# Patient Record
Sex: Female | Born: 1965 | Race: Black or African American | Hispanic: No | Smoking: Never smoker
Health system: Southern US, Community
[De-identification: ages and names within clinical notes are randomized; demographics above are authoritative.]

## PROBLEM LIST (undated history)

## (undated) DIAGNOSIS — D649 Anemia, unspecified: Secondary | ICD-10-CM

## (undated) DIAGNOSIS — K649 Unspecified hemorrhoids: Secondary | ICD-10-CM

## (undated) DIAGNOSIS — N644 Mastodynia: Secondary | ICD-10-CM

## (undated) DIAGNOSIS — I10 Essential (primary) hypertension: Secondary | ICD-10-CM

## (undated) DIAGNOSIS — N920 Excessive and frequent menstruation with regular cycle: Secondary | ICD-10-CM

## (undated) HISTORY — DX: Mastodynia: N64.4

## (undated) HISTORY — DX: Anemia, unspecified: D64.9

## (undated) HISTORY — DX: Unspecified hemorrhoids: K64.9

## (undated) HISTORY — DX: Essential (primary) hypertension: I10

## (undated) HISTORY — DX: Excessive and frequent menstruation with regular cycle: N92.0

## (undated) HISTORY — PX: TONSILLECTOMY: SUR1361

---

## 2002-03-09 ENCOUNTER — Other Ambulatory Visit (HOSPITAL_COMMUNITY): Admission: RE | Admit: 2002-03-09 | Discharge: 2002-03-30 | Payer: Self-pay | Admitting: Psychiatry

## 2006-05-28 ENCOUNTER — Ambulatory Visit: Payer: Self-pay | Admitting: Family Medicine

## 2006-06-07 ENCOUNTER — Ambulatory Visit: Payer: Self-pay | Admitting: *Deleted

## 2006-06-29 ENCOUNTER — Encounter (INDEPENDENT_AMBULATORY_CARE_PROVIDER_SITE_OTHER): Payer: Self-pay | Admitting: Family Medicine

## 2006-06-29 ENCOUNTER — Ambulatory Visit: Payer: Self-pay | Admitting: Family Medicine

## 2006-08-20 ENCOUNTER — Ambulatory Visit (HOSPITAL_COMMUNITY): Admission: RE | Admit: 2006-08-20 | Discharge: 2006-08-20 | Payer: Self-pay | Admitting: Family Medicine

## 2007-02-23 ENCOUNTER — Encounter (INDEPENDENT_AMBULATORY_CARE_PROVIDER_SITE_OTHER): Payer: Self-pay | Admitting: *Deleted

## 2008-05-14 ENCOUNTER — Ambulatory Visit: Payer: Self-pay | Admitting: Family Medicine

## 2008-05-14 ENCOUNTER — Encounter (INDEPENDENT_AMBULATORY_CARE_PROVIDER_SITE_OTHER): Payer: Self-pay | Admitting: Adult Health

## 2008-05-14 LAB — CONVERTED CEMR LAB
Albumin: 4 g/dL (ref 3.5–5.2)
Alkaline Phosphatase: 65 units/L (ref 39–117)
Basophils Absolute: 0.1 10*3/uL (ref 0.0–0.1)
Basophils Relative: 1 % (ref 0–1)
Chloride: 103 meq/L (ref 96–112)
HDL: 56 mg/dL (ref >39)
LDL Cholesterol: 108 mg/dL — ABNORMAL HIGH (ref 0–99)
MCHC: 30.9 g/dL (ref 30.0–36.0)
Monocytes Relative: 8 % (ref 3–12)
Neutro Abs: 4.4 10*3/uL (ref 1.7–7.7)
Neutrophils Relative %: 66 % (ref 43–77)
Potassium: 4.3 meq/L (ref 3.5–5.3)
RBC: 4.6 M/uL (ref 3.87–5.11)
RDW: 15.2 % (ref 11.5–15.5)
Total Bilirubin: 0.4 mg/dL (ref 0.3–1.2)
Triglycerides: 73 mg/dL (ref <150)
VLDL: 15 mg/dL (ref 0–40)

## 2008-06-06 ENCOUNTER — Ambulatory Visit (HOSPITAL_COMMUNITY): Admission: RE | Admit: 2008-06-06 | Discharge: 2008-06-06 | Payer: Self-pay | Admitting: Internal Medicine

## 2008-09-05 ENCOUNTER — Ambulatory Visit: Payer: Self-pay | Admitting: Internal Medicine

## 2009-06-10 ENCOUNTER — Encounter (INDEPENDENT_AMBULATORY_CARE_PROVIDER_SITE_OTHER): Payer: Self-pay | Admitting: Adult Health

## 2009-06-10 ENCOUNTER — Ambulatory Visit (HOSPITAL_COMMUNITY): Admission: RE | Admit: 2009-06-10 | Discharge: 2009-06-10 | Payer: Self-pay | Admitting: Internal Medicine

## 2009-06-10 ENCOUNTER — Ambulatory Visit: Payer: Self-pay | Admitting: Family Medicine

## 2009-06-10 LAB — CONVERTED CEMR LAB
ALT: 9 units/L (ref 0–35)
AST: 14 units/L (ref 0–37)
Albumin: 4.3 g/dL (ref 3.5–5.2)
Alkaline Phosphatase: 73 units/L (ref 39–117)
Calcium: 9.5 mg/dL (ref 8.4–10.5)
Chloride: 100 meq/L (ref 96–112)
Creatinine, Ser: 0.72 mg/dL (ref 0.40–1.20)
LDL Cholesterol: 114 mg/dL — ABNORMAL HIGH (ref 0–99)
Lymphocytes Relative: 30 % (ref 12–46)
Lymphs Abs: 1.7 10*3/uL (ref 0.7–4.0)
Microalb, Ur: 0.5 mg/dL (ref 0.00–1.89)
Neutro Abs: 3.4 10*3/uL (ref 1.7–7.7)
Neutrophils Relative %: 60 % (ref 43–77)
Platelets: 415 10*3/uL — ABNORMAL HIGH (ref 150–400)
Potassium: 4.1 meq/L (ref 3.5–5.3)
TSH: 0.941 microintl units/mL (ref 0.350–4.500)
WBC: 5.6 10*3/uL (ref 4.0–10.5)

## 2009-06-13 ENCOUNTER — Ambulatory Visit: Payer: Self-pay | Admitting: Internal Medicine

## 2012-11-01 ENCOUNTER — Ambulatory Visit: Payer: No Typology Code available for payment source | Attending: Family Medicine | Admitting: Internal Medicine

## 2012-11-01 VITALS — BP 144/91 | HR 84 | Temp 98.7°F | Resp 16 | Wt 237.2 lb

## 2012-11-01 DIAGNOSIS — I10 Essential (primary) hypertension: Secondary | ICD-10-CM | POA: Insufficient documentation

## 2012-11-01 DIAGNOSIS — N938 Other specified abnormal uterine and vaginal bleeding: Secondary | ICD-10-CM | POA: Insufficient documentation

## 2012-11-01 DIAGNOSIS — R51 Headache: Secondary | ICD-10-CM

## 2012-11-01 DIAGNOSIS — N949 Unspecified condition associated with female genital organs and menstrual cycle: Secondary | ICD-10-CM | POA: Insufficient documentation

## 2012-11-01 LAB — BASIC METABOLIC PANEL
Chloride: 102 mEq/L (ref 96–112)
Creat: 0.78 mg/dL (ref 0.50–1.10)
Potassium: 4.3 mEq/L (ref 3.5–5.3)

## 2012-11-01 LAB — CBC WITH DIFFERENTIAL/PLATELET
Basophils Absolute: 0 10*3/uL (ref 0.0–0.1)
HCT: 34.8 % — ABNORMAL LOW (ref 36.0–46.0)
Lymphocytes Relative: 29 % (ref 12–46)
Neutro Abs: 3.7 10*3/uL (ref 1.7–7.7)
Platelets: 429 10*3/uL — ABNORMAL HIGH (ref 150–400)
RBC: 4.47 MIL/uL (ref 3.87–5.11)
RDW: 16.4 % — ABNORMAL HIGH (ref 11.5–15.5)
WBC: 6.1 10*3/uL (ref 4.0–10.5)

## 2012-11-01 MED ORDER — TRAMADOL HCL 50 MG PO TABS: 50.0000 mg | ORAL_TABLET | Freq: Three times a day (TID) | ORAL | Status: DC | PRN

## 2012-11-01 MED ORDER — LISINOPRIL-HYDROCHLOROTHIAZIDE 20-12.5 MG PO TABS: 1.0000 | ORAL_TABLET | Freq: Every day | ORAL | Status: DC

## 2012-11-01 NOTE — Progress Notes (Signed)
Patient states has not been on her blood pressure medication for almost a year Complains of headaches

## 2012-11-01 NOTE — Progress Notes (Signed)
  Subjective:    Patient ID: Marie Garcia, female    DOB: 1965-07-14, 47 y.o.   MRN: [id number]  HPI 47 year old female who presents to [facility] for evaluation of hypertension. The patient has a history of hypertension and was on Zestoretic one year ago. She lost followup with [facility] and has been off her medication. She'll complains of morning headaches. She has checked her blood pressure at [facility] and it is usually in the 180 systolic. Today she is in the 140 systolic. She takes ibuprofen every morning for her headaches.   Review of Systems    H.  As in history of present illness  Past medical history Hypertension   past surgical history None  Allergies None  Family history Positive for breast cancer in the grandmother   Objective:   Physical Exam  General: NAD, resting comfortably in bed Eyes: PEERLA EOMI ENT: mucous membranes moist, patient very hard of hearing Neck: supple w/o JVD Cardiovascular: RRR w/o MRG Respiratory: CTA B Abdomen: soft, nt, nd, bs+ Skin: no rash nor lesion Musculoskeletal: MAE, full ROM all 4 extremities Psychiatric: normal tone and affect Neurologic: AAOx3, grossly non-foc       Assessment & Plan:  Hypertension. The patient will be prescribed systolic We'll obtain baseline labs such as BMP Follow up in 2 weeks  Dysfunctional uterine bleeding-the patient states that she bleeds about 15 days a month for the last 8 months. She denies any shortness of breath, we'll obtain a CBC, gynecologic referral

## 2013-01-10 ENCOUNTER — Ambulatory Visit: Payer: No Typology Code available for payment source | Attending: Family Medicine | Admitting: Internal Medicine

## 2013-01-10 VITALS — BP 136/89 | HR 98 | Temp 98.9°F | Ht 61.0 in | Wt 236.0 lb

## 2013-01-10 DIAGNOSIS — N644 Mastodynia: Secondary | ICD-10-CM

## 2013-01-10 DIAGNOSIS — D539 Nutritional anemia, unspecified: Secondary | ICD-10-CM

## 2013-01-10 DIAGNOSIS — I10 Essential (primary) hypertension: Secondary | ICD-10-CM | POA: Insufficient documentation

## 2013-01-10 LAB — COMPREHENSIVE METABOLIC PANEL
ALT: 11 U/L (ref 0–35)
AST: 13 U/L (ref 0–37)
BUN: 8 mg/dL (ref 6–23)
CO2: 28 mEq/L (ref 19–32)
Chloride: 103 mEq/L (ref 96–112)
Creat: 0.77 mg/dL (ref 0.50–1.10)
Potassium: 4.6 mEq/L (ref 3.5–5.3)

## 2013-01-10 LAB — LIPID PANEL
HDL: 50 mg/dL (ref >39)
LDL Cholesterol: 114 mg/dL — ABNORMAL HIGH (ref 0–99)
Total CHOL/HDL Ratio: 3.6 Ratio
Triglycerides: 74 mg/dL (ref <150)

## 2013-01-10 LAB — HEMOGLOBIN A1C: Hgb A1c MFr Bld: 6.3 % — ABNORMAL HIGH (ref <5.7)

## 2013-01-10 MED ORDER — LISINOPRIL-HYDROCHLOROTHIAZIDE 20-25 MG PO TABS: 1.0000 | ORAL_TABLET | Freq: Every day | ORAL | Status: DC

## 2013-01-10 NOTE — Progress Notes (Signed)
Patient ID: Marie Garcia, female   DOB: 1966-01-03, 47 y.o.   MRN: [id number]  CC:  HPI: 47 year old female who is here for evaluation of her hypertension. The patient was last seen in May when she presented with complaints of a headache. She was started on Zestoretic. Her headaches have since improved today she is complaining of pain in her left breast. She had a mammogram that was negative in 2009. Pain is described as sharp episodic not related to exertion Patient denies any shortness of breath per se She continues to have heavy menstrual periods, and is yet to receive a gynecologic referral   No Known Allergies No past medical history on file. Current Outpatient Prescriptions on File Prior to Visit  Medication Sig Dispense Refill  . lisinopril-hydrochlorothiazide (ZESTORETIC) 20-12.5 MG per tablet Take 1 tablet by mouth daily.  90 tablet  3  . traMADol (ULTRAM) 50 MG tablet Take 1 tablet (50 mg total) by mouth every 8 (eight) hours as needed for pain.  30 tablet  0   No current facility-administered medications on file prior to visit.   No family history on file. History   Social History  . Marital Status: Legally Separated    Spouse Name: N/A    Number of Children: N/A  . Years of Education: N/A   Occupational History  . Not on file.   Social History Main Topics  . Smoking status: Not on file  . Smokeless tobacco: Not on file  . Alcohol Use: Not on file  . Drug Use: Not on file  . Sexually Active: Not on file   Other Topics Concern  . Not on file   Social History Narrative  . No narrative on file    Review of Systems  Constitutional: Negative for fever, chills, diaphoresis, activity change, appetite change and fatigue.  HENT: Negative for ear pain, nosebleeds, congestion, facial swelling, rhinorrhea, neck pain, neck stiffness and ear discharge.   Eyes: Negative for pain, discharge, redness, itching and visual disturbance.  Respiratory: Negative for cough, choking,  chest tightness, shortness of breath, wheezing and stridor.   Cardiovascular: Negative for chest pain, palpitations and leg swelling.  Gastrointestinal: Negative for abdominal distention.  Genitourinary: Negative for dysuria, urgency, frequency, hematuria, flank pain, decreased urine volume, difficulty urinating and dyspareunia.  Musculoskeletal: Negative for back pain, joint swelling, arthralgias and gait problem.  Neurological: Negative for dizziness, tremors, seizures, syncope, facial asymmetry, speech difficulty, weakness, light-headedness, numbness and headaches.  Hematological: Negative for adenopathy. Does not bruise/bleed easily.  Psychiatric/Behavioral: Negative for hallucinations, behavioral problems, confusion, dysphoric mood, decreased concentration and agitation.    Objective:   Filed Vitals:   01/10/13 0951  BP: 136/89  Pulse: 98  Temp: 98.9 F (37.2 C)    Physical Exam  Constitutional: Appears well-developed and well-nourished. No distress.  HENT: Normocephalic. External right and left ear normal. Oropharynx is clear and moist.  Eyes: Conjunctivae and EOM are normal. PERRLA, no scleral icterus.  Neck: Normal ROM. Neck supple. No JVD. No tracheal deviation. No thyromegaly.  [facility]: RRR, S1/S2 +, no murmurs, no gallops, no carotid bruit.  Pulmonary: Effort and breath sounds normal, no stridor, rhonchi, wheezes, rales.  Abdominal: Soft. BS +,  no distension, tenderness, rebound or guarding.  Musculoskeletal: Normal range of motion. No edema and no tenderness.  Lymphadenopathy: No lymphadenopathy noted, cervical, inguinal. Neuro: Alert. Normal reflexes, muscle tone coordination. No cranial nerve deficit. Skin: Skin is warm and dry. No rash noted. Not diaphoretic. No erythema.  No pallor.  Psychiatric: Normal mood and affect. Behavior, judgment, thought content normal.   Lab Results  Component Value Date   WBC 6.1 11/01/2012   HGB 11.2* 11/01/2012   HCT 34.8* 11/01/2012    MCV 77.9* 11/01/2012   PLT 429* 11/01/2012   Lab Results  Component Value Date   CREATININE 0.78 11/01/2012   BUN 9 11/01/2012   NA 135 11/01/2012   K 4.3 11/01/2012   CL 102 11/01/2012   CO2 26 11/01/2012    No results found for this basename: HGBA1C   Lipid Panel     Component Value Date/Time   CHOL 186 06/10/2009 1908   TRIG 70 06/10/2009 1908   HDL 58 06/10/2009 1908   CHOLHDL 3.2 Ratio 06/10/2009 1908   VLDL 14 06/10/2009 1908   LDLCALC 114* 06/10/2009 1908       Assessment and plan:   There are no active problems to display for this patient.  Hypertension continue Zestoretic, 2 refills provided We'll check a BMP today  Health maintenance Will check lipid panel, hemoglobin A1c  Left-sided chest pain we'll check a mammogram and schedule the patient for an outpatient stress test, atypical chest pain  Anemia heavy menstrual periods, schedule the patient for a pelvic ultrasound  See the patient back in 3 weeks

## 2013-01-17 ENCOUNTER — Ambulatory Visit (HOSPITAL_COMMUNITY)
Admission: RE | Admit: 2013-01-17 | Discharge: 2013-01-17 | Disposition: A | Discharge status: Home / Self Care | Payer: No Typology Code available for payment source | Source: Ambulatory Visit | Attending: Internal Medicine | Admitting: Internal Medicine

## 2013-01-17 DIAGNOSIS — D539 Nutritional anemia, unspecified: Secondary | ICD-10-CM

## 2013-01-17 DIAGNOSIS — N920 Excessive and frequent menstruation with regular cycle: Secondary | ICD-10-CM | POA: Insufficient documentation

## 2013-01-24 ENCOUNTER — Ambulatory Visit
Admission: RE | Admit: 2013-01-24 | Discharge: 2013-01-24 | Disposition: A | Discharge status: Home / Self Care | Payer: No Typology Code available for payment source | Source: Ambulatory Visit | Attending: Family Medicine | Admitting: Family Medicine

## 2013-01-24 DIAGNOSIS — N644 Mastodynia: Secondary | ICD-10-CM

## 2013-01-31 ENCOUNTER — Ambulatory Visit: Payer: No Typology Code available for payment source | Admitting: Internal Medicine

## 2013-02-07 ENCOUNTER — Encounter: Payer: Self-pay | Admitting: *Deleted

## 2013-02-07 ENCOUNTER — Encounter: Payer: Self-pay | Admitting: Cardiology

## 2013-02-07 ENCOUNTER — Ambulatory Visit (INDEPENDENT_AMBULATORY_CARE_PROVIDER_SITE_OTHER): Payer: No Typology Code available for payment source | Admitting: Cardiology

## 2013-02-07 VITALS — BP 138/80 | HR 104 | Ht 60.0 in | Wt 233.0 lb

## 2013-02-07 DIAGNOSIS — N644 Mastodynia: Secondary | ICD-10-CM | POA: Insufficient documentation

## 2013-02-07 DIAGNOSIS — I10 Essential (primary) hypertension: Secondary | ICD-10-CM | POA: Insufficient documentation

## 2013-02-07 DIAGNOSIS — R079 Chest pain, unspecified: Secondary | ICD-10-CM

## 2013-02-07 DIAGNOSIS — D649 Anemia, unspecified: Secondary | ICD-10-CM | POA: Insufficient documentation

## 2013-02-07 DIAGNOSIS — R5383 Other fatigue: Secondary | ICD-10-CM

## 2013-02-07 DIAGNOSIS — G4730 Sleep apnea, unspecified: Secondary | ICD-10-CM

## 2013-02-07 DIAGNOSIS — R5381 Other malaise: Secondary | ICD-10-CM

## 2013-02-07 NOTE — Assessment & Plan Note (Signed)
Patient counseled on weight loss. Her hemoglobin A1c was recently elevated. We discussed this and the risk for diabetes mellitus. I recommended diet, exercise and weight loss and followup with primary care.

## 2013-02-07 NOTE — Assessment & Plan Note (Signed)
Symptoms atypical and not consistent with cardiac pain. Electrocardiogram normal. I do not think further cardiac evaluation indicated.

## 2013-02-07 NOTE — Assessment & Plan Note (Signed)
Continue present blood pressure medications. 

## 2013-02-07 NOTE — Progress Notes (Signed)
  HPI: 47 year old female for evaluation of chest pain. Patient has no prior cardiac history. She describes intermittent pain in the left breast area. It is described as a sharp pain lasting 1-2 seconds. It is not exertional, pleuritic. It does not radiate and there are no associated symptoms. She has dyspnea with more extreme activities but not routine activities. No orthopnea, PND, palpitations, syncope or exertional chest pain. She also describes fatigue. She has some dizziness after taking her a.m. Blood pressure medications that lasted approximately 30 minutes.  Current Outpatient Prescriptions  Medication Sig Dispense Refill  . lisinopril-hydrochlorothiazide (PRINZIDE,ZESTORETIC) 20-25 MG per tablet Take 1 tablet by mouth daily.  30 tablet  2  . Multiple Vitamin (MULTIVITAMIN) capsule Take 1 capsule by mouth daily.      . traMADol (ULTRAM) 50 MG tablet Take 1 tablet (50 mg total) by mouth every 8 (eight) hours as needed for pain.  30 tablet  0   No current facility-administered medications for this visit.    No Known Allergies  Past Medical History  Diagnosis Date  . Breast pain   . Anemia   . HTN (hypertension)     Past Surgical History  Procedure Laterality Date  . Tonsillectomy      History   Social History  . Marital Status: Legally Separated    Spouse Name: N/A    Number of Children: 3  . Years of Education: N/A   Occupational History  .      [employer]   Social History Main Topics  . Smoking status: Never Smoker   . Smokeless tobacco: Not on file  . Alcohol Use: No  . Drug Use: No  . Sexual Activity: Not on file   Other Topics Concern  . Not on file   Social History Narrative  . No narrative on file    Family History  Problem Relation Age of Onset  . Breast cancer Maternal Grandmother     ROS: no fevers or chills, productive cough, hemoptysis, dysphasia, odynophagia, melena, hematochezia, dysuria, hematuria, rash, seizure activity, orthopnea, PND, pedal  edema, claudication. Remaining systems are negative.  Physical Exam:   Blood pressure 138/80, pulse 104, height 5' (1.524 m), weight 233 lb (105.688 kg), last menstrual period 01/07/2013.  General:  Well developed/obese in NAD Skin warm/dry Patient not depressed No peripheral clubbing Back-normal HEENT-normal/normal eyelids Neck supple/normal carotid upstroke bilaterally; no bruits; no JVD; no thyromegaly chest - CTA/ normal expansion CV - RRR/normal S1 and S2; no murmurs, rubs or gallops;  PMI nondisplaced Abdomen -NT/ND, no HSM, no mass, + bowel sounds, no bruit 2+ femoral pulses, no bruits Ext-no edema, chords, 2+ DP Neuro-grossly nonfocal  ECG sinus tachycardia, left axis deviation, no ST changes.

## 2013-02-07 NOTE — Patient Instructions (Addendum)
Your physician recommends that you schedule a follow-up appointment in: AS NEEDED  REFERRAL TO PULMONARY FOR SLEEP APNEA  Your physician recommends that you HAVE LAB WORK TODAY

## 2013-02-07 NOTE — Assessment & Plan Note (Signed)
Etiology unclear. She has mild microcytic anemia possibly secondary to menstruation. Further evaluation per primary care. She does snore and certainly sleep apnea could be contributing. I will asked pulmonary to review for sleep study. Check TSH.

## 2013-02-08 LAB — TSH: TSH: 1.08 u[IU]/mL (ref 0.35–5.50)

## 2013-02-13 ENCOUNTER — Encounter: Payer: Self-pay | Admitting: Obstetrics & Gynecology

## 2013-03-13 ENCOUNTER — Institutional Professional Consult (permissible substitution): Payer: No Typology Code available for payment source | Admitting: Pulmonary Disease

## 2013-03-27 ENCOUNTER — Encounter: Payer: Self-pay | Admitting: Obstetrics & Gynecology

## 2013-03-27 ENCOUNTER — Other Ambulatory Visit (HOSPITAL_COMMUNITY)
Admission: RE | Admit: 2013-03-27 | Discharge: 2013-03-27 | Disposition: A | Discharge status: Home / Self Care | Payer: No Typology Code available for payment source | Source: Ambulatory Visit | Attending: Obstetrics & Gynecology | Admitting: Obstetrics & Gynecology

## 2013-03-27 ENCOUNTER — Ambulatory Visit (INDEPENDENT_AMBULATORY_CARE_PROVIDER_SITE_OTHER): Payer: No Typology Code available for payment source | Admitting: Obstetrics & Gynecology

## 2013-03-27 VITALS — BP 130/81 | HR 112 | Temp 98.2°F | Ht 60.0 in | Wt 223.5 lb

## 2013-03-27 DIAGNOSIS — N920 Excessive and frequent menstruation with regular cycle: Secondary | ICD-10-CM

## 2013-03-27 DIAGNOSIS — Z01812 Encounter for preprocedural laboratory examination: Secondary | ICD-10-CM

## 2013-03-27 LAB — POCT PREGNANCY, URINE: Preg Test, Ur: NEGATIVE

## 2013-03-27 MED ORDER — NORGESTIMATE-ETH ESTRADIOL 0.25-35 MG-MCG PO TABS: 1.0000 | ORAL_TABLET | Freq: Every day | ORAL | Status: DC

## 2013-03-27 NOTE — Progress Notes (Signed)
Subjective:     Patient ID: Marie Garcia, female   DOB: 20-May-1966, 47 y.o.   MRN: [id number]  HPI Pt reports several months of heavy vaginal bleeding.  She reports that she may bleed as much as 20+days.  She had a sono ordered by her primary care physician but, has not seen an OB/GYN.  She denies pain assoc with the bleeding but, does c/o hot flushes that are very bothersome.  She has been taking care of her husband who died yesterday of lung cancer,  His funeral is in 3 days.  She denies unexplained weight loss.  Past Medical History  Diagnosis Date  . Breast pain   . Anemia   . HTN (hypertension)   . Menorrhagia    Past Surgical History  Procedure Laterality Date  . Tonsillectomy     Current Outpatient Prescriptions on File Prior to Visit  Medication Sig Dispense Refill  . lisinopril-hydrochlorothiazide (PRINZIDE,ZESTORETIC) 20-25 MG per tablet Take 1 tablet by mouth daily.  30 tablet  2  . Multiple Vitamin (MULTIVITAMIN) capsule Take 1 capsule by mouth daily.      . traMADol (ULTRAM) 50 MG tablet Take 1 tablet (50 mg total) by mouth every 8 (eight) hours as needed for pain.  30 tablet  0   No current facility-administered medications on file prior to visit.  No Known Allergies History   Social History  . Marital Status: Legally Separated    Spouse Name: N/A    Number of Children: 3  . Years of Education: N/A   Occupational History  .      [employer]   Social History Main Topics  . Smoking status: Never Smoker   . Smokeless tobacco: Not on file  . Alcohol Use: No  . Drug Use: No  . Sexual Activity: No   Other Topics Concern  . Not on file   Social History Narrative  . No narrative on file          Review of Systems     Objective:   Physical Exam BP 130/81  Pulse 112  Temp(Src) 98.2 F (36.8 C)  Ht 5' (1.524 m)  Wt 223 lb 8 oz (101.379 kg)  BMI 43.65 kg/m2  LMP 03/24/2013 GU: EGBUS: no lesions Vagina: no blood in vault Cervix: no lesion; no  mucopurulent d/c Uterus: enlarged ~12 weeks sized Adnexa: no masses; nontender     The indications for endometrial biopsy were reviewed.   Risks of the biopsy including cramping, bleeding, infection, uterine perforation, inadequate specimen and need for additional procedures  were discussed. The patient states she understands and agrees to undergo procedure today. Consent was signed. Time out was performed. Urine HCG was negative. A sterile speculum was placed in the patient's vagina and the cervix was prepped with Betadine. A single-toothed tenaculum was placed on the anterior lip of the cervix to stabilize it. The 3 mm pipelle was introduced into the endometrial cavity without difficulty to a depth of 9.5cm, and a moderate amount of tissue was obtained and sent to pathology. The instruments were removed from the patient's vagina. Minimal bleeding from the cervix was noted. The patient tolerated the procedure well.   01/17/2013 TRANSABDOMINAL ULTRASOUND OF PELVIS  Technique: Transabdominal ultrasound examination of the pelvis was  performed including evaluation of the uterus, ovaries, adnexal  regions, and pelvic cul-de-sac.  Comparison: None.  Findings:  Uterus: Measures 12.4 x 5.3 x 6.2 cm. Anteverted. Homogeneous.  No evidence of mass.  Endometrium: Normal in thickness and appearance. Measures 8 mm in  thickness.  Right ovary: Normal appearance/no adnexal mass. Measures 2.1 x 2.6  x 2.6 cm.  Left ovary: Normal appearance/no adnexal mass. Measures 2.5 x 1.6  x 2.7 cm.  Other Findings: No free fluid  IMPRESSION:  Normal study. No evidence of pelvic mass or other significant  abnormality.     Assessment:     menorrhagia suspect due to perimenopause - discussed with pt treatment options including OCP's.  Pt with no contraindications to OCP's     Plan:     sprintec 1 po q day F/u in 2 months F/u results of endobx    Routine post-procedure instructions were given to the patient.  The patient will follow up to review the results

## 2013-03-27 NOTE — Progress Notes (Signed)
States has been having very heavy periods for about 6 months and they last up to 23 days.

## 2013-03-27 NOTE — Patient Instructions (Signed)
Menopause Menopause is the normal time of life when menstrual periods stop completely. Menopause is complete when you have missed 12 consecutive menstrual periods. It usually occurs between the ages of 48 to 55, with an average age of 51. Very rarely does a woman develop menopause before 49 years old. At menopause, your ovaries stop producing the female hormones, estrogen and progesterone. This can cause undesirable symptoms and also affect your health. Sometimes the symptoms may occur 4 to 5 years before the menopause begins. There is no relationship between menopause and:  Oral contraceptives.  Number of children you had.  Race.  The age your menstrual periods started (menarche). Heavy smokers and very thin women may develop menopause earlier in life. CAUSES  The ovaries stop producing the female hormones estrogen and progesterone.  Other causes include:  Surgery to remove both ovaries.  The ovaries stop functioning for no known reason.  Tumors of the pituitary gland in the brain.  Medical disease that affects the ovaries and hormone production.  Radiation treatment to the abdomen or pelvis.  Chemotherapy that affects the ovaries. SYMPTOMS   Hot flashes.  Night sweats.  Decrease in sex drive.  Vaginal dryness and thinning of the vagina causing painful intercourse.  Dryness of the skin and developing wrinkles.  Headaches.  Tiredness.  Irritability.  Memory problems.  Weight gain.  Bladder infections.  Hair growth of the face and chest.  Infertility. More serious symptoms include:  Loss of bone (osteoporosis) causing breaks (fractures).  Depression.  Hardening and narrowing of the arteries (atherosclerosis) causing heart attacks and strokes. DIAGNOSIS   When the menstrual periods have stopped for 12 straight months.  Physical exam.  Hormone studies of the blood. TREATMENT  There are many treatment choices and nearly as many questions about them.  The decisions to treat or not to treat menopausal changes is an individual choice made with your caregiver. Your caregiver can discuss the treatments with you. Together, you can decide which treatment will work best for you. Your treatment choices may include:   Hormone therapy (estorgen and progesterone).  Non-hormonal medications.  Treating the individual symptoms with medication (for example antidepressants for depression).  Herbal medications that may help specific symptoms.  Counseling by a psychiatrist or psychologist.  Group therapy.  Lifestyle changes including:  Eating healthy.  Regular exercise.  Limiting caffeine and alcohol.  Stress management and meditation.  No treatment. HOME CARE INSTRUCTIONS   Take the medication your caregiver gives you as directed.  Get plenty of sleep and rest.  Exercise regularly.  Eat a diet that contains calcium (good for the bones) and soy products (acts like estrogen hormone).  Avoid alcoholic beverages.  Do not smoke.  If you have hot flashes, dress in layers.  Take supplements, calcium and vitamin D to strengthen bones.  You can use over-the-counter lubricants or moisturizers for vaginal dryness.  Group therapy is sometimes very helpful.  Acupuncture may be helpful in some cases. SEEK MEDICAL CARE IF:   You are not sure you are in menopause.  You are having menopausal symptoms and need advice and treatment.  You are still having menstrual periods after age 55.  You have pain with intercourse.  Menopause is complete (no menstrual period for 12 months) and you develop vaginal bleeding.  You need a referral to a specialist (gynecologist, psychiatrist or psychologist) for treatment. SEEK IMMEDIATE MEDICAL CARE IF:   You have severe depression.  You have excessive vaginal bleeding.  You fell and   think you have a broken bone.  You have pain when you urinate.  You develop leg or chest pain.  You have a fast  pounding heart beat (palpitations).  You have severe headaches.  You develop vision problems.  You feel a lump in your breast.  You have abdominal pain or severe indigestion. Document Released: 08/15/2003 Document Revised: 08/17/2011 Document Reviewed: 03/22/2008 [facility] Patient Information 2014 [place], [place]. Perimenopause Perimenopause is the time when your body begins to move into the menopause (no menstrual period for 12 straight months). It is a natural process. Perimenopause can begin 2 to 8 years before the menopause and usually lasts for one year after the menopause. During this time, your ovaries may or may not produce an egg. The ovaries vary in their production of estrogen and progesterone hormones each month. This can cause irregular menstrual periods, difficulty in getting pregnant, vaginal bleeding between periods and uncomfortable symptoms. CAUSES  Irregular production of the ovarian hormones, estrogen and progesterone, and not ovulating every month.  Other causes include:  Tumor of the pituitary gland in the brain.  Medical disease that affects the ovaries.  Radiation treatment.  Chemotherapy.  Unknown causes.  Heavy smoking and excessive alcohol intake can bring on perimenopause sooner. SYMPTOMS   Hot flashes.  Night sweats.  Irregular menstrual periods.  Decrease sex drive.  Vaginal dryness.  Headaches.  Mood swings.  Depression.  Memory problems.  Irritability.  Tiredness.  Weight gain.  Trouble getting pregnant.  The beginning of losing bone cells (osteoporosis).  The beginning of hardening of the arteries (atherosclerosis). DIAGNOSIS  Your caregiver will make a diagnosis by analyzing your age, menstrual history and your symptoms. They will do a physical exam noting any changes in your body, especially your female organs. Female hormone tests may or may not be helpful depending on the amount and when you produce the female hormones.  However, other hormone tests may be helpful (ex. thyroid hormone) to rule out other problems. TREATMENT  The decision to treat during the perimenopause should be made by you and your caregiver depending on how the symptoms are affecting you and your life style. There are various treatments available such as:  Treating individual symptoms with a specific medication for that symptom (ex. tranquilizer for depression).  Herbal medications that can help specific symptoms.  Counseling.  Group therapy.  No treatment. HOME CARE INSTRUCTIONS   Before seeing your caregiver, make a list of your menstrual periods (when the occur, how heavy they are, how long between periods and how long they last), your symptoms and when they started.  Take the medication as recommended by your caregiver.  Sleep and rest.  Exercise.  Eat a diet that contains calcium (good for your bones) and soy (acts like estrogen hormone).  Do not smoke.  Avoid alcoholic beverages.  Taking vitamin E may help in certain cases.  Take calcium and vitamin D supplements to help prevent bone loss.  Group therapy is sometimes helpful.  Acupuncture may help in some cases. SEEK MEDICAL CARE IF:   You have any of the above and want to know if it is perimenopause.  You want advice and treatment for any of your symptoms mentioned above.  You need a referral to a specialist (gynecologist, psychiatrist or psychologist). SEEK IMMEDIATE MEDICAL CARE IF:   You have vaginal bleeding.  Your period lasts longer than 8 days.  You periods are recurring sooner than 21 days.  You have bleeding after intercourse.  You  have severe depression.  You have pain when you urinate.  You have severe headaches.  You develop vision problems. Document Released: 07/02/2004 Document Revised: 08/17/2011 Document Reviewed: 03/22/2008 [facility] Patient Information 2014 [place], [place].

## 2013-03-29 ENCOUNTER — Encounter: Payer: Self-pay | Admitting: *Deleted

## 2013-04-05 ENCOUNTER — Telehealth: Payer: Self-pay | Admitting: General Practice

## 2013-04-05 NOTE — Telephone Encounter (Signed)
Called patient and informed her of results and recommendations. Patient verbalized understanding to all and had no further questions  

## 2013-04-05 NOTE — Telephone Encounter (Signed)
Message copied by Kathee Delton on Wed Apr 05, 2013  1:00 PM ------      Message from: Odelia Gage A      Created: Tue Apr 04, 2013 12:19 PM                   ----- Message -----         From: Willodean Rosenthal, MD         Sent: 04/03/2013   2:04 PM           To: [facility]            Please call pt and notify her of normal endo bx.  She is to continue Sprintec and f/u in 2-3 months as scheduled.            Thx,      clh-S  ------

## 2013-04-19 ENCOUNTER — Institutional Professional Consult (permissible substitution): Payer: No Typology Code available for payment source | Admitting: Pulmonary Disease

## 2013-04-20 ENCOUNTER — Encounter: Payer: Self-pay | Admitting: Pulmonary Disease

## 2013-04-22 ENCOUNTER — Other Ambulatory Visit: Payer: Self-pay | Admitting: Emergency Medicine

## 2013-04-22 MED ORDER — LISINOPRIL-HYDROCHLOROTHIAZIDE 20-25 MG PO TABS: 1.0000 | ORAL_TABLET | Freq: Every day | ORAL | Status: DC

## 2013-09-17 ENCOUNTER — Other Ambulatory Visit: Payer: Self-pay | Admitting: Internal Medicine

## 2013-12-14 ENCOUNTER — Encounter (HOSPITAL_COMMUNITY): Payer: Self-pay | Admitting: Emergency Medicine

## 2013-12-14 ENCOUNTER — Emergency Department (HOSPITAL_COMMUNITY)
Admission: EM | Admit: 2013-12-14 | Discharge: 2013-12-14 | Disposition: A | Discharge status: Home / Self Care | Payer: Self-pay | Attending: Emergency Medicine | Admitting: Emergency Medicine

## 2013-12-14 ENCOUNTER — Emergency Department (HOSPITAL_COMMUNITY): Payer: Self-pay

## 2013-12-14 DIAGNOSIS — I10 Essential (primary) hypertension: Secondary | ICD-10-CM | POA: Insufficient documentation

## 2013-12-14 DIAGNOSIS — Z862 Personal history of diseases of the blood and blood-forming organs and certain disorders involving the immune mechanism: Secondary | ICD-10-CM | POA: Insufficient documentation

## 2013-12-14 DIAGNOSIS — Z79899 Other long term (current) drug therapy: Secondary | ICD-10-CM | POA: Insufficient documentation

## 2013-12-14 DIAGNOSIS — R1030 Lower abdominal pain, unspecified: Secondary | ICD-10-CM

## 2013-12-14 DIAGNOSIS — Z791 Long term (current) use of non-steroidal anti-inflammatories (NSAID): Secondary | ICD-10-CM | POA: Insufficient documentation

## 2013-12-14 DIAGNOSIS — I880 Nonspecific mesenteric lymphadenitis: Secondary | ICD-10-CM | POA: Insufficient documentation

## 2013-12-14 DIAGNOSIS — Z8742 Personal history of other diseases of the female genital tract: Secondary | ICD-10-CM | POA: Insufficient documentation

## 2013-12-14 DIAGNOSIS — R195 Other fecal abnormalities: Secondary | ICD-10-CM | POA: Insufficient documentation

## 2013-12-14 LAB — COMPREHENSIVE METABOLIC PANEL
ALBUMIN: 3.5 g/dL (ref 3.5–5.2)
ALT: 12 U/L (ref 0–35)
ANION GAP: 14 (ref 5–15)
AST: 23 U/L (ref 0–37)
Alkaline Phosphatase: 78 U/L (ref 39–117)
BUN: 9 mg/dL (ref 6–23)
CHLORIDE: 98 meq/L (ref 96–112)
CO2: 23 meq/L (ref 19–32)
CREATININE: 0.71 mg/dL (ref 0.50–1.10)
Calcium: 10 mg/dL (ref 8.4–10.5)
GFR calc Af Amer: >90 mL/min (ref >90)
Glucose, Bld: 118 mg/dL — ABNORMAL HIGH (ref 70–99)
Potassium: 4.1 mEq/L (ref 3.7–5.3)
Sodium: 135 mEq/L — ABNORMAL LOW (ref 137–147)
Total Protein: 7.2 g/dL (ref 6.0–8.3)

## 2013-12-14 LAB — CBC
HEMATOCRIT: 34.5 % — AB (ref 36.0–46.0)
HEMOGLOBIN: 11.4 g/dL — AB (ref 12.0–15.0)
MCH: 27 pg (ref 26.0–34.0)
MCHC: 33 g/dL (ref 30.0–36.0)
MCV: 81.8 fL (ref 78.0–100.0)
PLATELETS: 376 10*3/uL (ref 150–400)
RBC: 4.22 MIL/uL (ref 3.87–5.11)
RDW: 14.2 % (ref 11.5–15.5)
WBC: 6.3 10*3/uL (ref 4.0–10.5)

## 2013-12-14 LAB — TYPE AND SCREEN
ABO/RH(D): B POS
Antibody Screen: NEGATIVE

## 2013-12-14 LAB — POC OCCULT BLOOD, ED: Fecal Occult Bld: NEGATIVE

## 2013-12-14 LAB — LIPASE, BLOOD: Lipase: 32 U/L (ref 11–59)

## 2013-12-14 LAB — I-STAT CG4 LACTIC ACID, ED: LACTIC ACID, VENOUS: 1.02 mmol/L (ref 0.5–2.2)

## 2013-12-14 LAB — ABO/RH: ABO/RH(D): B POS

## 2013-12-14 MED ORDER — NAPROXEN 375 MG PO TABS: 375.0000 mg | ORAL_TABLET | Freq: Two times a day (BID) | ORAL | Status: DC

## 2013-12-14 MED ORDER — HYDROMORPHONE HCL PF 1 MG/ML IJ SOLN
1.0000 mg | Freq: Once | INTRAMUSCULAR | Status: AC
  Administered 2013-12-14: 1 mg via INTRAVENOUS
  Filled 2013-12-14: qty 1

## 2013-12-14 MED ORDER — SODIUM CHLORIDE 0.9 % IV BOLUS (SEPSIS)
1000.0000 mL | Freq: Once | INTRAVENOUS | Status: AC
Start: 2013-12-14 — End: 2013-12-14
  Administered 2013-12-14: 1000 mL via INTRAVENOUS

## 2013-12-14 MED ORDER — IOHEXOL 300 MG/ML  SOLN
50.0000 mL | Freq: Once | INTRAMUSCULAR | Status: AC | PRN
  Administered 2013-12-14: 50 mL via ORAL

## 2013-12-14 MED ORDER — HYDROCODONE-ACETAMINOPHEN 5-325 MG PO TABS: 2.0000 | ORAL_TABLET | ORAL | Status: DC | PRN

## 2013-12-14 MED ORDER — IOHEXOL 300 MG/ML  SOLN
100.0000 mL | Freq: Once | INTRAMUSCULAR | Status: AC | PRN
  Administered 2013-12-14: 100 mL via INTRAVENOUS

## 2013-12-14 MED ORDER — HYDROCODONE-ACETAMINOPHEN 5-325 MG PO TABS
2.0000 | ORAL_TABLET | Freq: Once | ORAL | Status: DC
  Filled 2013-12-14: qty 2

## 2013-12-14 NOTE — ED Provider Notes (Signed)
CSN: [id number]     Arrival date & time 12/14/13  1038 History   First MD Initiated Contact with Patient 12/14/13 1048     Chief Complaint  Patient presents with  . Abdominal Pain     (Consider location/radiation/quality/duration/timing/severity/associated sxs/prior Treatment) HPI Comments: 48 year old female with history of anemia, high blood pressure, obesity presents with intermittent crampy/ache abdominal pain central and lower bilateral for the past week with intermittent small amounts of blood in the stool.  Patient denies history of colonoscopy or known GI bleeding history. Episodes worse with BM and wiping.   No fevers or vomiting.    Patient is a 48 y.o. female presenting with abdominal pain. The history is provided by the patient.  Abdominal Pain Associated symptoms: no chest pain, no chills, no dysuria, no fever, no shortness of breath and no vomiting     Past Medical History  Diagnosis Date  . Breast pain   . Anemia   . HTN (hypertension)   . Menorrhagia    Past Surgical History  Procedure Laterality Date  . Tonsillectomy     Family History  Problem Relation Age of Onset  . Breast cancer Maternal Grandmother    History  Substance Use Topics  . Smoking status: Never Smoker   . Smokeless tobacco: Not on file  . Alcohol Use: No   OB History   Grav Para Term Preterm Abortions TAB SAB Ect Mult Living                 Review of Systems  Constitutional: Negative for fever and chills.  HENT: Negative for congestion.   Eyes: Negative for visual disturbance.  Respiratory: Negative for shortness of breath.   Cardiovascular: Negative for chest pain.  Gastrointestinal: Positive for abdominal pain and blood in stool. Negative for vomiting.  Genitourinary: Negative for dysuria and flank pain.  Musculoskeletal: Negative for back pain, neck pain and neck stiffness.  Skin: Negative for rash.  Neurological: Negative for light-headedness and headaches.      Allergies   Review of patient's allergies indicates no known allergies.  Home Medications   Prior to Admission medications   Medication Sig Start Date End Date Taking? Authorizing Provider  ibuprofen (ADVIL,MOTRIN) 200 MG tablet Take 400 mg by mouth every 6 (six) hours as needed for moderate pain.   Yes Historical Provider, MD  lisinopril-hydrochlorothiazide (PRINZIDE,ZESTORETIC) 20-25 MG per tablet Take 1 tablet by mouth daily. 04/22/13  Yes Jeanann Lewandowskylugbemiga Jegede, MD  Multiple Vitamin (MULTIVITAMIN) capsule Take 1 capsule by mouth daily.   Yes Historical Provider, MD  HYDROcodone-acetaminophen (NORCO) 5-325 MG per tablet Take 2 tablets by mouth every 4 (four) hours as needed. 12/14/13   Enid SkeensJoshua M Myleah Cavendish, MD  naproxen (NAPROSYN) 375 MG tablet Take 1 tablet (375 mg total) by mouth 2 (two) times daily. 12/14/13   Enid SkeensJoshua M Brae Schaafsma, MD   BP 152/93  Pulse 66  Temp(Src) 98.1 F (36.7 C) (Oral)  Resp 18  SpO2 97%  LMP 12/08/2013 Physical Exam  Nursing note and vitals reviewed. Constitutional: She is oriented to person, place, and time. She appears well-developed and well-nourished.  HENT:  Head: Normocephalic and atraumatic.  Eyes: Conjunctivae are normal. Right eye exhibits no discharge. Left eye exhibits no discharge.  Neck: Normal range of motion. Neck supple. No tracheal deviation present.  Cardiovascular: Normal rate and regular rhythm.   Pulmonary/Chest: Effort normal and breath sounds normal.  Abdominal: Soft. She exhibits no distension. There is tenderness (mild diffuse worse central and  LLQ). There is no guarding.  Musculoskeletal: She exhibits no edema.  Neurological: She is alert and oriented to person, place, and time.  Skin: Skin is warm. No rash noted.  Psychiatric: She has a normal mood and affect.    ED Course  Procedures (including critical care time) Labs Review Labs Reviewed  CBC - Abnormal; Notable for the following:    Hemoglobin 11.4 (*)    HCT 34.5 (*)    All other components  within normal limits  COMPREHENSIVE METABOLIC PANEL - Abnormal; Notable for the following:    Sodium 135 (*)    Glucose, Bld 118 (*)    Total Bilirubin <0.2 (*)    All other components within normal limits  LIPASE, BLOOD  POC OCCULT BLOOD, ED  I-STAT CG4 LACTIC ACID, ED  TYPE AND SCREEN  ABO/RH    Imaging Review Ct Abdomen Pelvis W Contrast  12/14/2013   CLINICAL DATA:  Abdominal pain.  Rectal bleeding.  EXAM: CT ABDOMEN AND PELVIS WITH CONTRAST  TECHNIQUE: Multidetector CT imaging of the abdomen and pelvis was performed using the standard protocol following bolus administration of intravenous contrast.  CONTRAST:  92mLScottsdale Eye InClCommunity Hospital Of San Berna22BerBurna [id number] KitchenrKn72m9wSpringhill MedClWellbridge Hospital Of Fort 278BerBurna 430865KoMarland KitchenrKno78m3wChattanooga Pain Management Center LLC Dba Chat[facility] Pain SurClA M Surgery C51BerBurna 200865KoMarland KitchenrKno80w5doyalty AndersonL 300 MG/ML SOLN, 10<MEASUREMENTBehavioral Healthcare Center At Huntsville, Inc.[place] Pi1[facility] Cen40tNaval ar22cChristene Raliegh Cha73m84Clearview SurgeryClBay State Wing Memorial Hospital And Medical Ce33BerBurna 510865KoMarland KitchenrKno42w5doyalty Andersonimiro Nee1Venetia Max7<MEASUREMENTMercy Health Lakeshore CampusNewman Pi1[facility] Hosp5iNaval ar50cChristene Raliegh [id number] Nee1Venetia Max8<MEASUREMENTPacific Endo Surgical Center LPNewman Pi1Saint Thomas West HospitalA7nNaval ar44cChristene Raliegh Cha48413292Orlan9147Casimiro Nee1Venetia Max3<MEASUREMENTSuncoast Specialty Surgery Center LlLPNewman Pi1[facility] Hospit48aNaval ar22cChristene Raliegh Cha3m84[facility] MedClSaint Andrews Hospital And Healthcare C38BerBurna 650865KoMarland KitchenrKno39w5doyalty Andersonimiro Nee1Venetia Max(5<MEASUREMENTBaylor Scott And White PavilionNewman Pi1[facility] Cent24eNaval ar31cChristene Raliegh Cha48413292Orlan9147Casimiro Nee1Venetia Max5<MEASUREMENTCoordinated Health Orthopedic HospitalNewman Pi1Dr [facility] Cen94tNaval ar29cChristene Raliegh Cha42m84Advanced Eye SurgeryClMethodist Hospital Union C2BerBurna 580865KoMarland KitchenrKno66w5doyalty Andersonimiro Nee1Venetia Ma<MEASUREMENTBethesda Hospital WestN60eNaval ar3873mChJohn C Stennis MemoriClSt Joseph'S Hospital 822BerBurna 340865KoMarland KitchenrKno39m1wMontgomery Eye SurgeryClLaser And Cataract Center Of Shrevepor334BerBurna 400865KoMarland KitchenrKno40w5doyalty Anderson13Orlan914Venetia Max7<MEASUREMENTDivine Providence HospitalNewman Pi1N[facility] Health [facility] Sy75sNaval ar21cChristene Raliegh Cha48413292Orlan9147Casimiro Nee1Venetia Max4<MEASUREMENTNaval Hospital GuamNewman Pi1T J [facility] Hosp40iNaval ar50cChristene Raliegh Cha16m84Southern Crescent EndoscoClChi St Joseph Health Madison Hos779BerBurna 160865KoMarland KitchenrKno56w5doyalty Andersonimiro Nee1Venetia Max(25<MEASUREMENTMercy Medical CenterNewman Pi1[facility] Center34 Naval ar29cChristene Raliegh Cha31m84[facility] Dba Confluence HealClHunterdon Medical C619BerBurna 30865KoMarland KitchenrKno89w5doyalty Andersonimiro Nee1Venetia Max9<MEASUREMENTPacific Endo Surgical Center LPNewman Pi1[facility]23ANaval ar66cChristene Raliegh Cha53m84Coast SurgerClPenn State Hershey Rehabilitation Hos5BerBurna 30865KoMarland KitchenrKno66w5doyalty Andersonimiro Nee1Venetia Max7<MEASUREMENTFort Walton Beach Medical CenterNewman Pi1[facility] PaA47pNaval ar59cChristene Raliegh Cha48413292Orlan9147Casimiro Nee1Venetia Max5<MEASUREMENTCascade Valley Arlington Surgery CenterNewman Pi1[facility] Heal56tNaval ar86cChristene Raliegh Cha67m84St. ElizClAlta Bates Summit Med Ctr-Summit Campus-S1BerBurna 320865KoMarland KitchenrKno32w5doyalty Andersonimiro Nee1Venetia Max2<MEASUREMENTWilmington Health PLLCNewman Pi1Endoscopy Center Of Da32yNaval ar23cChristene Raliegh Cha58m84Kind[facility] - Fort WortClRoper St Francis [facility] Hos759BerBurna 10865KoMarland KitchenrKno43w5doyalty Andersonimiro Nee1Venetia Ma<MEASUREMENTMichigan Endoscopy Center LLCNewman Pi1Maricopa Medical Cente75rNaval ar65cChristene Raliegh Cha56m84Summit Surgery Centere St MClParkview Adventist Medical Center : [facility] Hos66BerBurna 510865KoMarland KitchenrKno28w5doyalty Andersonimiro Nee1Venetia Max2<MEASUREMENTDupont Surgery CenterNewman Pi1C S Medical LLC Dba [facility] Surgical Arts75LNaval ar12cChristene Raliegh Cha65m84[facility] At ClCommunity Mental Health Cente90BerBurna 890865KoMarland KitchenrKno23w5doyalty Andersonimiro Nee1Venetia Max(3<MEASUREMENTAbrom Kaplan Memorial HospitalNewman Pi1Cole[facility] Cataract [facility] Laser Surgery Cent62eNaval ar34cChristene Raliegh Cha38m84[facility]are Of ClChrist Hos427BerBurna 870865KoMarland KitchenrKno67w5doyalty Andersonimiro Nee1Venetia Max2<MEASUREMENTBaptist HospitalNewman Pi1St. Elizabeth OwenChesapeake Ra65nNaval ar67cChristene Raliegh Cha34ClWake Forest Outpatient Endoscopy B115m08Walter O[facility] Moss Regional MedClBurgess Memorial Hos379BerBurna 600865KoMarland KitchenrKno23w5doyalty Andersonimiro Nee1Venetia Max5<MEASUREMENTJordan Valley Medical Center West Valley CampusNewman Pi1[facility] In56cNaval ar71cChristene Raliegh Cha53m84[facility] AcClPeacehealth Gastroenterology Endoscopy C640BerBurna 630865KoMarland KitchenrKno72w5doyalty Andersonimiro Nee1Venetia Max9<MEASUREMENTOlympic Medical CenterNewman Pi1[facility] Faci78lNaval ar70cChristene Raliegh [id number] Nee1Venetia Maxon0lethro BolusrdXOL 300 MG/ML SOLN  COMPARISON:  None.  FINDINGS: Liver normal. Spleen normal. Pancreas normal. No biliary distention. The gallbladder is nondistended.  Adrenals normal. Kidneys normal. Bladder is nondistended. Uterus and adnexa unremarkable.  Shotty inguinal and retroperitoneal lymph nodes are noted.  Appendix is unremarkable. Large amount of stool is noted throughout the colon. Colon is nondistended. There is no bowel distention. Small hiatal hernia. Stomach is nondistended. No free air. Small mesenteric lymph nodes are noted. Mesenteric adenitis could present in this fashion. No significant hernia.  Heart size normal.  Lung bases clear.  No acute bony abnormality.  IMPRESSION: 1. Nonspecific shotty inguinal and retroperitoneal lymph nodes. 2. Tiny nonspecific mesenteric lymph nodes. Mesenteric adenitis cannot be excluded.   Electronically Signed   By: Thomas  Register   On: 12/14/2013 14:57     EKG Interpretation None      MDM   Final diagnoses:  Abdominal pain, lower  Occult blood in stools  Mesenteric adenitis   Clinically concern for colitis/ diverticulitis vs internal hemorrhoid.  No active bleeding in ED, labs Unremarkable.  Pain improved on reassessment.  CT results reviewed, mild adenitis.  Pt has no  vascular hx and vitals unremarkable mild htn. Fup with GI discussed.   Results and differential diagnosis were discussed with the patient/parent/guardian. Close follow up outpatient was discussed, comfortable with the plan.   Medications  HYDROcodone-acetaminophen (NORCO/VICODIN) 5-325 MG per tablet 2 tablet (not administered)  HYDROmorphone (DILAUDID) injection 1 mg (1 mg Intravenous Given 12/14/13 1202)  sodium chloride 0.9 % bolus 1,000 mL (0 mLs Intravenous Stopped 12/14/13 1312)  iohexol (OMNIPAQUE) 300 MG/ML solution 50 mL (50 mLs Oral Contrast Given 12/14/13 1218)  iohexol (OMNIPAQUE) 300 MG/ML solution 100 mL (100 mLs Intravenous Contrast Given 12/14/13 1438)    Filed Vitals:   12/14/13 1045 12/14/13 1200 12/14/13 1300  BP: 134/90 133/82 152/93  Pulse: 91 76 66  Temp: 98.1 F (36.7 C)    TempSrc: Oral    Resp: 18    SpO2: 100% 97% 97%        Hydeia Mcatee M Carina Chaplin, MD 12/14/13 1516

## 2013-12-14 NOTE — ED Notes (Signed)
Initial Contact - pt resting on stretcher, reports RLQ/LLQ abd pain, distention, also BRBPR x1 week.  Pt denies n/v/d/c, denies fevers/chills, denies hx of similar.  Skin PWD.  A+Ox4.  Speaking full/clear sentences.  Abd s/TTP, distended.  NAD.

## 2013-12-14 NOTE — Progress Notes (Signed)
[facility] CL provided pt with a [facility] [employer] Card application to help patient establish primary care.  °

## 2013-12-14 NOTE — ED Notes (Signed)
Pt states that she has had generalized abd pain and rectal bleeding w/ BRB x 1 and a half wks.  States that it happens every time she uses the bathroom.  Denies any constipation or diarrhea.

## 2013-12-14 NOTE — ED Notes (Addendum)
Pt reports rectal bleeding around stool and with wiping for a week with severe abdominal pain and distention. Pt denies n/v/d.

## 2013-12-14 NOTE — Discharge Instructions (Signed)
If you were given medicines take as directed.  If you are on coumadin or contraceptives realize their levels and effectiveness is altered by many different medicines.  If you have any reaction (rash, tongues swelling, other) to the medicines stop taking and see a physician.   Please follow up as directed and return to the ER or see a physician for new or worsening symptoms.  Thank you. Filed Vitals:   12/14/13 1045 12/14/13 1200 12/14/13 1300  BP: 134/90 133/82 152/93  Pulse: 91 76 66  Temp: 98.1 F (36.7 C)    TempSrc: Oral    Resp: 18    SpO2: 100% 97% 97%    Abdominal Pain Many things can cause abdominal pain. Usually, abdominal pain is not caused by a disease and will improve without treatment. It can often be observed and treated at home. Your health care provider will do a physical exam and possibly order blood tests and X-rays to help determine the seriousness of your pain. However, in many cases, more time must pass before a clear cause of the pain can be found. Before that point, your health care provider may not know if you need more testing or further treatment. HOME CARE INSTRUCTIONS  Monitor your abdominal pain for any changes. The following actions may help to alleviate any discomfort you are experiencing:  Only take over-the-counter or prescription medicines as directed by your health care provider.  Do not take laxatives unless directed to do so by your health care provider.  Try a clear liquid diet (broth, tea, or water) as directed by your health care provider. Slowly move to a bland diet as tolerated. SEEK MEDICAL CARE IF:  You have unexplained abdominal pain.  You have abdominal pain associated with nausea or diarrhea.  You have pain when you urinate or have a bowel movement.  You experience abdominal pain that wakes you in the night.  You have abdominal pain that is worsened or improved by eating food.  You have abdominal pain that is worsened with eating fatty  foods.  You have a fever. SEEK IMMEDIATE MEDICAL CARE IF:   Your pain does not go away within 2 hours.  You keep throwing up (vomiting).  Your pain is felt only in portions of the abdomen, such as the right side or the left lower portion of the abdomen.  You pass bloody or black tarry stools. MAKE SURE YOU:  Understand these instructions.   Will watch your condition.   Will get help right away if you are not doing well or get worse.  Document Released: 03/04/2005 Document Revised: 05/30/2013 Document Reviewed: 02/01/2013 [facility] StokesExitCare Patient Information 2015 [place], [place]. This information is not intended to replace advice given to you by your health care provider. Make sure you discuss any questions you have with your health care provider.

## 2013-12-14 NOTE — ED Notes (Signed)
Pt to CT

## 2013-12-14 NOTE — ED Notes (Signed)
Pt not given Vicodin as pt is driving home.

## 2014-01-09 ENCOUNTER — Ambulatory Visit: Payer: No Typology Code available for payment source | Attending: Internal Medicine

## 2014-01-12 ENCOUNTER — Ambulatory Visit: Payer: No Typology Code available for payment source | Attending: Internal Medicine | Admitting: Internal Medicine

## 2014-01-12 ENCOUNTER — Encounter: Payer: Self-pay | Admitting: Internal Medicine

## 2014-01-12 VITALS — BP 129/84 | HR 79 | Temp 99.2°F | Resp 16 | Ht 60.0 in | Wt 231.0 lb

## 2014-01-12 DIAGNOSIS — D649 Anemia, unspecified: Secondary | ICD-10-CM | POA: Insufficient documentation

## 2014-01-12 DIAGNOSIS — I10 Essential (primary) hypertension: Secondary | ICD-10-CM | POA: Insufficient documentation

## 2014-01-12 DIAGNOSIS — Z79899 Other long term (current) drug therapy: Secondary | ICD-10-CM | POA: Insufficient documentation

## 2014-01-12 DIAGNOSIS — Z1239 Encounter for other screening for malignant neoplasm of breast: Secondary | ICD-10-CM | POA: Insufficient documentation

## 2014-01-12 MED ORDER — LISINOPRIL-HYDROCHLOROTHIAZIDE 20-25 MG PO TABS: 1.0000 | ORAL_TABLET | Freq: Every day | ORAL | Status: DC

## 2014-01-12 NOTE — Progress Notes (Signed)
Pt here for medication refill blood pressure medication States she ran out 7 months ago C/o intermit headache with feet swelling Denies chest pain or sob BP 129/84 79 Requesting mammogram/Colonoscopy

## 2014-01-12 NOTE — Progress Notes (Signed)
Patient ID: Marie Garcia, female   DOB: Nov 13, 1965, 48 y.o.   MRN: [id number]  CC: HTN  HPI:  Patient reports that she takes her medication daily and has no compliants related to medication.  She reports a daily headache for the past few mornings last only one hour but reports that she has been out of medication for about 7 days.  She admits to occasional leg swelling. She request an order for colonoscopy and mammography.    No Known Allergies Past Medical History  Diagnosis Date  . Breast pain   . Anemia   . HTN (hypertension)   . Menorrhagia    Current Outpatient Prescriptions on File Prior to Visit  Medication Sig Dispense Refill  . HYDROcodone-acetaminophen (NORCO) 5-325 MG per tablet Take 2 tablets by mouth every 4 (four) hours as needed.  6 tablet  0  . ibuprofen (ADVIL,MOTRIN) 200 MG tablet Take 400 mg by mouth every 6 (six) hours as needed for moderate pain.      Marland Kitchen lisinopril-hydrochlorothiazide (PRINZIDE,ZESTORETIC) 20-25 MG per tablet Take 1 tablet by mouth daily.  30 tablet  2  . Multiple Vitamin (MULTIVITAMIN) capsule Take 1 capsule by mouth daily.      . naproxen (NAPROSYN) 375 MG tablet Take 1 tablet (375 mg total) by mouth 2 (two) times daily.  12 tablet  0   No current facility-administered medications on file prior to visit.   Family History  Problem Relation Age of Onset  . Breast cancer Maternal Grandmother    History   Social History  . Marital Status: Legally Separated    Spouse Name: N/A    Number of Children: 3  . Years of Education: N/A   Occupational History  .      [employer]   Social History Main Topics  . Smoking status: Never Smoker   . Smokeless tobacco: Not on file  . Alcohol Use: No  . Drug Use: No  . Sexual Activity: No   Other Topics Concern  . Not on file   Social History Narrative  . No narrative on file   Review of Systems  Eyes: Negative.   Respiratory: Negative.   Cardiovascular: Positive for leg swelling (improves with  antihypertensive meds). Negative for chest pain, palpitations, orthopnea and claudication.  Gastrointestinal: Negative.  Negative for abdominal pain.  Genitourinary: Negative.   Neurological: Positive for headaches. Negative for dizziness and tingling.     Objective:   Filed Vitals:   01/12/14 1430  BP: 129/84  Pulse: 79  Temp: 99.2 F (37.3 C)  Resp: 16    Physical Exam: Constitutional: Patient appears well-developed and well-nourished. No distress. HENT: Normocephalic, atraumatic, External right and left ear normal. Oropharynx is clear and moist.  Eyes: Conjunctivae and EOM are normal. PERRLA, no scleral icterus. Neck: Normal ROM. Neck supple. No JVD. No tracheal deviation. No thyromegaly. CVS: RRR, S1/S2 +, no murmurs, no gallops, no carotid bruit.  Pulmonary: Effort and breath sounds normal, no stridor, rhonchi, wheezes, rales.  Abdominal: Soft. BS +,  no distension, tenderness, rebound or guarding.  Musculoskeletal: Normal range of motion. No edema and no tenderness.  Lymphadenopathy: No lymphadenopathy noted, cervical Neuro: Alert. Normal reflexes Skin: Skin is warm and dry. No rash noted. Not diaphoretic. No erythema. No pallor. Psychiatric: Normal mood and affect. Behavior, judgment, thought content normal.  Lab Results  Component Value Date   WBC 6.3 12/14/2013   HGB 11.4* 12/14/2013   HCT 34.5* 12/14/2013   MCV  81.8 12/14/2013   PLT 376 12/14/2013   Lab Results  Component Value Date   CREATININE 0.71 12/14/2013   BUN 9 12/14/2013   NA 135* 12/14/2013   K 4.1 12/14/2013   CL 98 12/14/2013   CO2 23 12/14/2013    Lab Results  Component Value Date   HGBA1C 6.3* 01/10/2013   Lipid Panel     Component Value Date/Time   CHOL 179 01/10/2013 1007   TRIG 74 01/10/2013 1007   HDL 50 01/10/2013 1007   CHOLHDL 3.6 01/10/2013 1007   VLDL 15 01/10/2013 1007   LDLCALC 114* 01/10/2013 1007       Assessment and plan:   Marie Garcia was seen today for follow-up, hypertension and medication  refill.  Diagnoses and associated orders for this visit:  Essential hypertension - lisinopril-hydrochlorothiazide (PRINZIDE,ZESTORETIC) 20-25 MG per tablet; Take 1 tablet by mouth daily.  Breast cancer screening - MM Digital Screening; Future Patient will call back when she gets the [employer] discount to get referral for colonoscopy.   Return in about 6 months (around 07/15/2014) for Hypertension.    Holland Commons, NP-C [facility] [phone] 02/13/2014, 1:02 PM

## 2014-01-12 NOTE — Patient Instructions (Signed)
DASH Eating Plan °DASH stands for "Dietary Approaches to Stop Hypertension." The DASH eating plan is a healthy eating plan that has been shown to reduce high blood pressure (hypertension). Additional health benefits may include reducing the risk of type 2 diabetes mellitus, heart disease, and stroke. The DASH eating plan may also help with weight loss. °WHAT DO I NEED TO KNOW ABOUT THE DASH EATING PLAN? °For the DASH eating plan, you will follow these general guidelines: °· Choose foods with a percent daily value for sodium of less than 5% (as listed on the food label). °· Use salt-free seasonings or herbs instead of table salt or sea salt. °· Check with your health care provider or pharmacist before using salt substitutes. °· Eat lower-sodium products, often labeled as "lower sodium" or "no salt added." °· Eat fresh foods. °· Eat more vegetables, fruits, and low-fat dairy products. °· Choose whole grains. Look for the word "whole" as the first word in the ingredient list. °· Choose fish and skinless chicken or turkey more often than red meat. Limit fish, poultry, and meat to 6 oz (170 g) each day. °· Limit sweets, desserts, sugars, and sugary drinks. °· Choose heart-healthy fats. °· Limit cheese to 1 oz (28 g) per day. °· Eat more home-cooked food and less restaurant, buffet, and fast food. °· Limit fried foods. °· Cook foods using methods other than frying. °· Limit canned vegetables. If you do use them, rinse them well to decrease the sodium. °· When eating at a restaurant, ask that your food be prepared with less salt, or no salt if possible. °WHAT FOODS CAN I EAT? °Seek help from a dietitian for individual calorie needs. °Grains °Whole grain or whole wheat bread. Brown rice. Whole grain or whole wheat pasta. Quinoa, bulgur, and whole grain cereals. Low-sodium cereals. Corn or whole wheat flour tortillas. Whole grain cornbread. Whole grain crackers. Low-sodium crackers. °Vegetables °Fresh or frozen vegetables  (raw, steamed, roasted, or grilled). Low-sodium or reduced-sodium tomato and vegetable juices. Low-sodium or reduced-sodium tomato sauce and paste. Low-sodium or reduced-sodium canned vegetables.  °Fruits °All fresh, canned (in natural juice), or frozen fruits. °Meat and Other Protein Products °Ground beef (85% or leaner), grass-fed beef, or beef trimmed of fat. Skinless chicken or turkey. Ground chicken or turkey. Pork trimmed of fat. All fish and seafood. Eggs. Dried beans, peas, or lentils. Unsalted nuts and seeds. Unsalted canned beans. °Dairy °Low-fat dairy products, such as skim or 1% milk, 2% or reduced-fat cheeses, low-fat ricotta or cottage cheese, or plain low-fat yogurt. Low-sodium or reduced-sodium cheeses. °Fats and Oils °Tub margarines without trans fats. Light or reduced-fat mayonnaise and salad dressings (reduced sodium). Avocado. Safflower, olive, or canola oils. Natural peanut or almond butter. °Other °Unsalted popcorn and pretzels. °The items listed above may not be a complete list of recommended foods or beverages. Contact your dietitian for more options. °WHAT FOODS ARE NOT RECOMMENDED? °Grains °White bread. White pasta. White rice. Refined cornbread. Bagels and croissants. Crackers that contain trans fat. °Vegetables °Creamed or fried vegetables. Vegetables in a cheese sauce. Regular canned vegetables. Regular canned tomato sauce and paste. Regular tomato and vegetable juices. °Fruits °Dried fruits. Canned fruit in light or heavy syrup. Fruit juice. °Meat and Other Protein Products °Fatty cuts of meat. Ribs, chicken wings, bacon, sausage, bologna, salami, chitterlings, fatback, hot dogs, bratwurst, and packaged luncheon meats. Salted nuts and seeds. Canned beans with salt. °Dairy °Whole or 2% milk, cream, half-and-half, and cream cheese. Whole-fat or sweetened yogurt. Full-fat   cheeses or blue cheese. Nondairy creamers and whipped toppings. Processed cheese, cheese spreads, or cheese  curds. °Condiments °Onion and garlic salt, seasoned salt, table salt, and sea salt. Canned and packaged gravies. Worcestershire sauce. Tartar sauce. Barbecue sauce. Teriyaki sauce. Soy sauce, including reduced sodium. Steak sauce. Fish sauce. Oyster sauce. Cocktail sauce. Horseradish. Ketchup and mustard. Meat flavorings and tenderizers. Bouillon cubes. Hot sauce. Tabasco sauce. Marinades. Taco seasonings. Relishes. °Fats and Oils °Butter, stick margarine, lard, shortening, ghee, and bacon fat. Coconut, palm kernel, or palm oils. Regular salad dressings. °Other °Pickles and olives. Salted popcorn and pretzels. °The items listed above may not be a complete list of foods and beverages to avoid. Contact your dietitian for more information. °WHERE CAN I FIND MORE INFORMATION? °National [employer], [employer], [employer] Institute: www.nhlbi.nih.gov/health/health-topics/topics/dash/ °Document Released: 05/14/2011 Document Revised: 10/09/2013 Document Reviewed: 03/29/2013 °ExitCare® Patient Information ©2015 [employer]. This information is not intended to replace advice given to you by your health care provider. Make sure you discuss any questions you have with your health care provider. ° °

## 2014-01-26 ENCOUNTER — Ambulatory Visit
Admission: RE | Admit: 2014-01-26 | Discharge: 2014-01-26 | Disposition: A | Discharge status: Home / Self Care | Payer: No Typology Code available for payment source | Source: Ambulatory Visit | Attending: Internal Medicine | Admitting: Internal Medicine

## 2014-01-26 DIAGNOSIS — Z1239 Encounter for other screening for malignant neoplasm of breast: Secondary | ICD-10-CM

## 2014-02-16 ENCOUNTER — Emergency Department (HOSPITAL_COMMUNITY)
Admission: EM | Admit: 2014-02-16 | Discharge: 2014-02-16 | Disposition: A | Discharge status: Home / Self Care | Payer: Self-pay | Attending: Emergency Medicine | Admitting: Emergency Medicine

## 2014-02-16 ENCOUNTER — Encounter (HOSPITAL_COMMUNITY): Payer: Self-pay | Admitting: Emergency Medicine

## 2014-02-16 ENCOUNTER — Ambulatory Visit: Payer: No Typology Code available for payment source

## 2014-02-16 DIAGNOSIS — I10 Essential (primary) hypertension: Secondary | ICD-10-CM | POA: Insufficient documentation

## 2014-02-16 DIAGNOSIS — IMO0002 Reserved for concepts with insufficient information to code with codable children: Secondary | ICD-10-CM | POA: Insufficient documentation

## 2014-02-16 DIAGNOSIS — Z79899 Other long term (current) drug therapy: Secondary | ICD-10-CM | POA: Insufficient documentation

## 2014-02-16 DIAGNOSIS — Z862 Personal history of diseases of the blood and blood-forming organs and certain disorders involving the immune mechanism: Secondary | ICD-10-CM | POA: Insufficient documentation

## 2014-02-16 DIAGNOSIS — L258 Unspecified contact dermatitis due to other agents: Secondary | ICD-10-CM | POA: Insufficient documentation

## 2014-02-16 DIAGNOSIS — R21 Rash and other nonspecific skin eruption: Secondary | ICD-10-CM | POA: Insufficient documentation

## 2014-02-16 DIAGNOSIS — L259 Unspecified contact dermatitis, unspecified cause: Secondary | ICD-10-CM

## 2014-02-16 DIAGNOSIS — Z8742 Personal history of other diseases of the female genital tract: Secondary | ICD-10-CM | POA: Insufficient documentation

## 2014-02-16 MED ORDER — METHYLPREDNISOLONE SODIUM SUCC 125 MG IJ SOLR: 125.0000 mg | Freq: Once | INTRAMUSCULAR | Status: DC

## 2014-02-16 MED ORDER — PENICILLIN V POTASSIUM 500 MG PO TABS: 500.0000 mg | ORAL_TABLET | Freq: Once | ORAL | Status: DC

## 2014-02-16 MED ORDER — METHYLPREDNISOLONE SODIUM SUCC 125 MG IJ SOLR
125.0000 mg | Freq: Once | INTRAMUSCULAR | Status: AC
  Administered 2014-02-16: 125 mg via INTRAMUSCULAR
  Filled 2014-02-16: qty 2

## 2014-02-16 MED ORDER — PREDNISONE 20 MG PO TABS: ORAL_TABLET | ORAL | Status: DC

## 2014-02-16 MED ORDER — HYDROXYZINE HCL 25 MG PO TABS: 25.0000 mg | ORAL_TABLET | Freq: Four times a day (QID) | ORAL | Status: DC

## 2014-02-16 NOTE — ED Notes (Signed)
Per pt, multiple sites/lesions to enter body.  Legs arms. Nothing on face/back or belly.  Areas look like bug bites.  Pt did cut grass Tuesday morning.  Pt bites showed up that night.  Very itchy skin.

## 2014-02-16 NOTE — ED Provider Notes (Signed)
Medical screening examination/treatment/procedure(s) were performed by non-physician practitioner and as supervising physician I was immediately available for consultation/collaboration.   EKG Interpretation None       Adisyn Ruscitti R. Tanise Russman, MD 02/16/14 2354 

## 2014-02-16 NOTE — ED Provider Notes (Signed)
CSN: [id number]     Arrival date & time 02/16/14  1755 History  This chart was scribed for non-physician practitioner working with Harrold Donath R. Rubin Payor, MD, by Roxy Cedar ED Scribe. This patient was seen in room WTR5/WTR5 and the patient's care was started at 8:09 PM  Chief Complaint  Patient presents with  . Rash   The history is provided by the patient. No language interpreter was used.   HPI Comments: Marie Garcia is a 48 y.o. female who presents to the Emergency Department complaining of a moderately itchy rash that began 3 days ago while patient was cutting shrubs outside. Patient states she has rash on arms and lower legs bilaterally and on neck. She states that she has taken benadryl with no relief. Patient states she does not have a prior history of a similar rash. She denies any food or environmental allergies. Patient denies any exposure to new laundry detergents or chemical agents. She denies any crusting or drainage noticed to rash. Patient describes rash as fluid filled blisters. Patient denies history of DM.   Past Medical History  Diagnosis Date  . Breast pain   . Anemia   . HTN (hypertension)   . Menorrhagia    Past Surgical History  Procedure Laterality Date  . Tonsillectomy     Family History  Problem Relation Age of Onset  . Breast cancer Maternal Grandmother    History  Substance Use Topics  . Smoking status: Never Smoker   . Smokeless tobacco: Not on file  . Alcohol Use: No   OB History   Grav Para Term Preterm Abortions TAB SAB Ect Mult Living                 Review of Systems  Constitutional: Negative for fever and chills.  HENT: Negative for facial swelling and trouble swallowing.   Eyes: Negative for redness.  Respiratory: Negative for shortness of breath, wheezing and stridor.   Cardiovascular: Negative for chest pain.  Gastrointestinal: Negative for nausea and vomiting.  Musculoskeletal: Negative for myalgias.  Skin: Positive for rash.   Allergic/Immunologic: Negative for environmental allergies and food allergies.  Neurological: Negative for light-headedness.  Psychiatric/Behavioral: Negative for confusion.   Allergies  Review of patient's allergies indicates no known allergies.  Home Medications   Prior to Admission medications   Medication Sig Start Date End Date Taking? Authorizing Provider  HYDROcodone-acetaminophen (NORCO) 5-325 MG per tablet Take 2 tablets by mouth every 4 (four) hours as needed. 12/14/13   Enid Skeens, MD  ibuprofen (ADVIL,MOTRIN) 200 MG tablet Take 400 mg by mouth every 6 (six) hours as needed for moderate pain.    Historical Provider, MD  lisinopril-hydrochlorothiazide (PRINZIDE,ZESTORETIC) 20-25 MG per tablet Take 1 tablet by mouth daily. 01/12/14   Ambrose Finland, NP  Multiple Vitamin (MULTIVITAMIN) capsule Take 1 capsule by mouth daily.    Historical Provider, MD  naproxen (NAPROSYN) 375 MG tablet Take 1 tablet (375 mg total) by mouth 2 (two) times daily. 12/14/13   Enid Skeens, MD   Triage Vitals: BP 133/88  Pulse 96  Temp(Src) 98.9 F (37.2 C) (Oral)  Resp 16  SpO2 99%  LMP 01/28/2014  Physical Exam  Nursing note and vitals reviewed. Constitutional: She is oriented to person, place, and time. She appears well-developed and well-nourished. No distress.  HENT:  Head: Normocephalic and atraumatic.  Eyes: Conjunctivae and EOM are normal.  Neck: Neck supple. No tracheal deviation present.  Cardiovascular: Normal rate.  Pulmonary/Chest: Effort normal. No respiratory distress.  Musculoskeletal: Normal range of motion.  Neurological: She is alert and oriented to person, place, and time.  Skin: Skin is warm and dry. Rash (contact dermatitis) noted.  Scattered vesicles, some in a linear distribution, in some exposed areas of upper extremities, ankles, posterior neck consistent with allergic contact dermatitis  Psychiatric: She has a normal mood and affect. Her behavior is normal.    ED Course  Procedures (including critical care time)  DIAGNOSTIC STUDIES: Oxygen Saturation is 99% on RA, normal by my interpretation.    COORDINATION OF CARE: 8:23 PM- Discussed plans to give patient steroid medication. Pt advised of plan for treatment and pt agrees.  Labs Review Labs Reviewed - No data to display  Imaging Review No results found.   EKG Interpretation None      Vital signs reviewed and are as follows: Filed Vitals:   02/16/14 1814  BP: 133/88  Pulse: 96  Temp: 98.9 F (37.2 C)  Resp: 16   IM Solu-Medrol given in ED. Prednisone and Atarax at home. Discussed need to take entire course of steroid taper to prevent rebound. Patient verbalizes understanding and agrees with plan.     MDM   Final diagnoses:  Contact dermatitis   Patient with skin rash consistent with allergic contact dermatitis. No signs of anaphylaxis. No signs of SJS. Do not suspect tickborne illness or viral exanthem. Patient appears well, nontoxic.  I personally performed the services described in this documentation, which was scribed in my presence. The recorded information has been reviewed and is accurate.   Renne Crigler, PA-C 02/16/14 2030

## 2014-02-16 NOTE — Discharge Instructions (Signed)
Please read and follow all provided instructions.  Your diagnoses today include:  1. Contact dermatitis     Tests performed today include:  Vital signs. See below for your results today.   Medications prescribed:   Prednisone - steroid medicine   It is best to take this medication in the morning to prevent sleeping problems. If you are diabetic, monitor your blood sugar closely and stop taking Prednisone if blood sugar is over 300. Take with food to prevent stomach upset.    Hydroxyzine - antihistamine  You can find this medication over-the-counter.   This medication will make you drowsy. DO NOT drive or perform any activities that require you to be awake and alert if taking this.  Take any prescribed medications only as directed.  Home care instructions:   Follow any educational materials contained in this packet  Follow-up instructions: Please follow-up with your primary care provider in the next 7 days for further evaluation of your symptoms.   Return instructions:   Please return to the Emergency Department if you experience worsening symptoms.   Call 9-1-1 immediately if you have an allergic reaction that involves your lips, mouth, throat or if you have any difficulty breathing. This is a life-threatening emergency.   Please return if you have any other emergent concerns.  Additional Information:  Your vital signs today were: BP 133/88   Pulse 96   Temp(Src) 98.9 F (37.2 C) (Oral)   Resp 16   SpO2 99%   LMP 01/28/2014 If your blood pressure (BP) was elevated above 135/85 this visit, please have this repeated by your doctor within one month. --------------

## 2014-09-07 ENCOUNTER — Encounter (HOSPITAL_COMMUNITY): Payer: Self-pay | Admitting: Emergency Medicine

## 2014-09-07 ENCOUNTER — Emergency Department (HOSPITAL_COMMUNITY)
Admission: EM | Admit: 2014-09-07 | Discharge: 2014-09-07 | Disposition: A | Discharge status: Home / Self Care | Payer: Self-pay | Attending: Emergency Medicine | Admitting: Emergency Medicine

## 2014-09-07 DIAGNOSIS — Z791 Long term (current) use of non-steroidal anti-inflammatories (NSAID): Secondary | ICD-10-CM | POA: Insufficient documentation

## 2014-09-07 DIAGNOSIS — D649 Anemia, unspecified: Secondary | ICD-10-CM | POA: Insufficient documentation

## 2014-09-07 DIAGNOSIS — H109 Unspecified conjunctivitis: Secondary | ICD-10-CM | POA: Insufficient documentation

## 2014-09-07 DIAGNOSIS — I10 Essential (primary) hypertension: Secondary | ICD-10-CM | POA: Insufficient documentation

## 2014-09-07 DIAGNOSIS — Z79899 Other long term (current) drug therapy: Secondary | ICD-10-CM | POA: Insufficient documentation

## 2014-09-07 DIAGNOSIS — Z8742 Personal history of other diseases of the female genital tract: Secondary | ICD-10-CM | POA: Insufficient documentation

## 2014-09-07 MED ORDER — TOBRAMYCIN 0.3 % OP SOLN
2.0000 [drp] | Freq: Four times a day (QID) | OPHTHALMIC | Status: DC
  Administered 2014-09-07: 2 [drp] via OPHTHALMIC
  Filled 2014-09-07: qty 5

## 2014-09-07 MED ORDER — LORATADINE 10 MG PO TABS: 10.0000 mg | ORAL_TABLET | Freq: Every day | ORAL | Status: DC

## 2014-09-07 NOTE — Discharge Instructions (Signed)

## 2014-09-07 NOTE — ED Provider Notes (Signed)
CSN: [id number]     Arrival date & time 09/07/14  1147 History  This chart was scribed for Elpidio AnisShari Mariusz Jubb, PA-C, working with Raeford RazorStephen Kohut, MD by Chestine SporeSoijett Blue, ED Scribe. The patient was seen in room WTR5/WTR5 at 12:10 PM.    Chief Complaint  Patient presents with  . Conjunctivitis      The history is provided by the patient. No language interpreter was used.    HPI Comments: Marie Garcia is a 49 y.o. female with a medical hx of HTN who presents to the Emergency Department complaining of conjuctiviis onset two days ago.  Pt denies any injury to the eyes. She states that she is having associated symptoms of sneezing, eye redness. She states that she has tried OTC eye drops with no relief for her symptoms. She denies visual disturbance, and any other symptoms. Pt is not allergic to any medications and she is otherwise healthy. Pt works in [employer].   PCP- Holland CommonsKECK, VALERIE, NP    Past Medical History  Diagnosis Date  . Breast pain   . Anemia   . HTN (hypertension)   . Menorrhagia    Past Surgical History  Procedure Laterality Date  . Tonsillectomy     Family History  Problem Relation Age of Onset  . Breast cancer Maternal Grandmother    History  Substance Use Topics  . Smoking status: Never Smoker   . Smokeless tobacco: Not on file  . Alcohol Use: No   OB History    No data available     Review of Systems  HENT: Positive for sneezing.   Eyes: Positive for redness. Negative for visual disturbance.      Allergies  Review of patient's allergies indicates no known allergies.  Home Medications   Prior to Admission medications   Medication Sig Start Date End Date Taking? Authorizing Provider  HYDROcodone-acetaminophen (NORCO) 5-325 MG per tablet Take 2 tablets by mouth every 4 (four) hours as needed. 12/14/13   Blane OharaJoshua Zavitz, MD  hydrOXYzine (ATARAX/VISTARIL) 25 MG tablet Take 1 tablet (25 mg total) by mouth every 6 (six) hours. 02/16/14   Renne CriglerJoshua Geiple, PA-C  ibuprofen  (ADVIL,MOTRIN) 200 MG tablet Take 400 mg by mouth every 6 (six) hours as needed for moderate pain.    Historical Provider, MD  lisinopril-hydrochlorothiazide (PRINZIDE,ZESTORETIC) 20-25 MG per tablet Take 1 tablet by mouth daily. 01/12/14   Ambrose FinlandValerie A Keck, NP  Multiple Vitamin (MULTIVITAMIN) capsule Take 1 capsule by mouth daily.    Historical Provider, MD  naproxen (NAPROSYN) 375 MG tablet Take 1 tablet (375 mg total) by mouth 2 (two) times daily. 12/14/13   Blane OharaJoshua Zavitz, MD  predniSONE (DELTASONE) 20 MG tablet 3 Tabs PO Days 1-3, then 2 tabs PO Days 4-6, then 1 tab PO Day 7-9, then Half Tab PO Day 10-12 02/16/14   Renne CriglerJoshua Geiple, PA-C   BP 124/95 mmHg  Pulse 95  Temp(Src) 98 F (36.7 C) (Oral)  Resp 20  SpO2 100%  Physical Exam  Constitutional: She is oriented to person, place, and time. She appears well-developed and well-nourished. No distress.  HENT:  Head: Normocephalic and atraumatic.  Nose: Rhinorrhea present.  Eyes: EOM are normal. Right eye exhibits chemosis. No scleral icterus.  Bilaterally conjuctiva redness. Right greater than left with mild chemosis. Cornea clear. No purulent drainage visualizxed. No facial swelling.   Neck: Neck supple. No tracheal deviation present.  Cardiovascular: Normal rate.   Pulmonary/Chest: Effort normal. No respiratory distress.  Musculoskeletal: Normal range  of motion.  Neurological: She is alert and oriented to person, place, and time.  Skin: Skin is warm and dry.  Psychiatric: She has a normal mood and affect. Her behavior is normal.  Nursing note and vitals reviewed.   ED Course  Procedures (including critical care time) DIAGNOSTIC STUDIES: Oxygen Saturation is 100% on RA, nl by my interpretation.    COORDINATION OF CARE: 12:12 PM-Discussed treatment plan which includes Zyrtec, abx eye drops, referal to eye doctor with pt at bedside and pt agreed to plan.   Labs Review Labs Reviewed - No data to display  Imaging Review No results  found.   EKG Interpretation None      MDM   Final diagnoses:  None    1. Conjunctivitis, bilateral  Symptoms allergic vs bacterial with matting and discharge, no improvement with antihistamine drops. Will cover with abx, refer to ophtho prn  I personally performed the services described in this documentation, which was scribed in my presence. The recorded information has been reviewed and is accurate.     Elpidio Anis, PA-C 09/07/14 1224  Raeford Razor, MD 09/08/14 [phone]

## 2014-09-10 IMAGING — MG MM SCREEN MAMMOGRAM BILATERAL
4 series · 4 of 4 positions shown · non-contrast
Comparison: Previous exam(s).

CLINICAL DATA: Screening.

EXAM:
DIGITAL SCREENING BILATERAL MAMMOGRAM WITH CAD

[R CC]
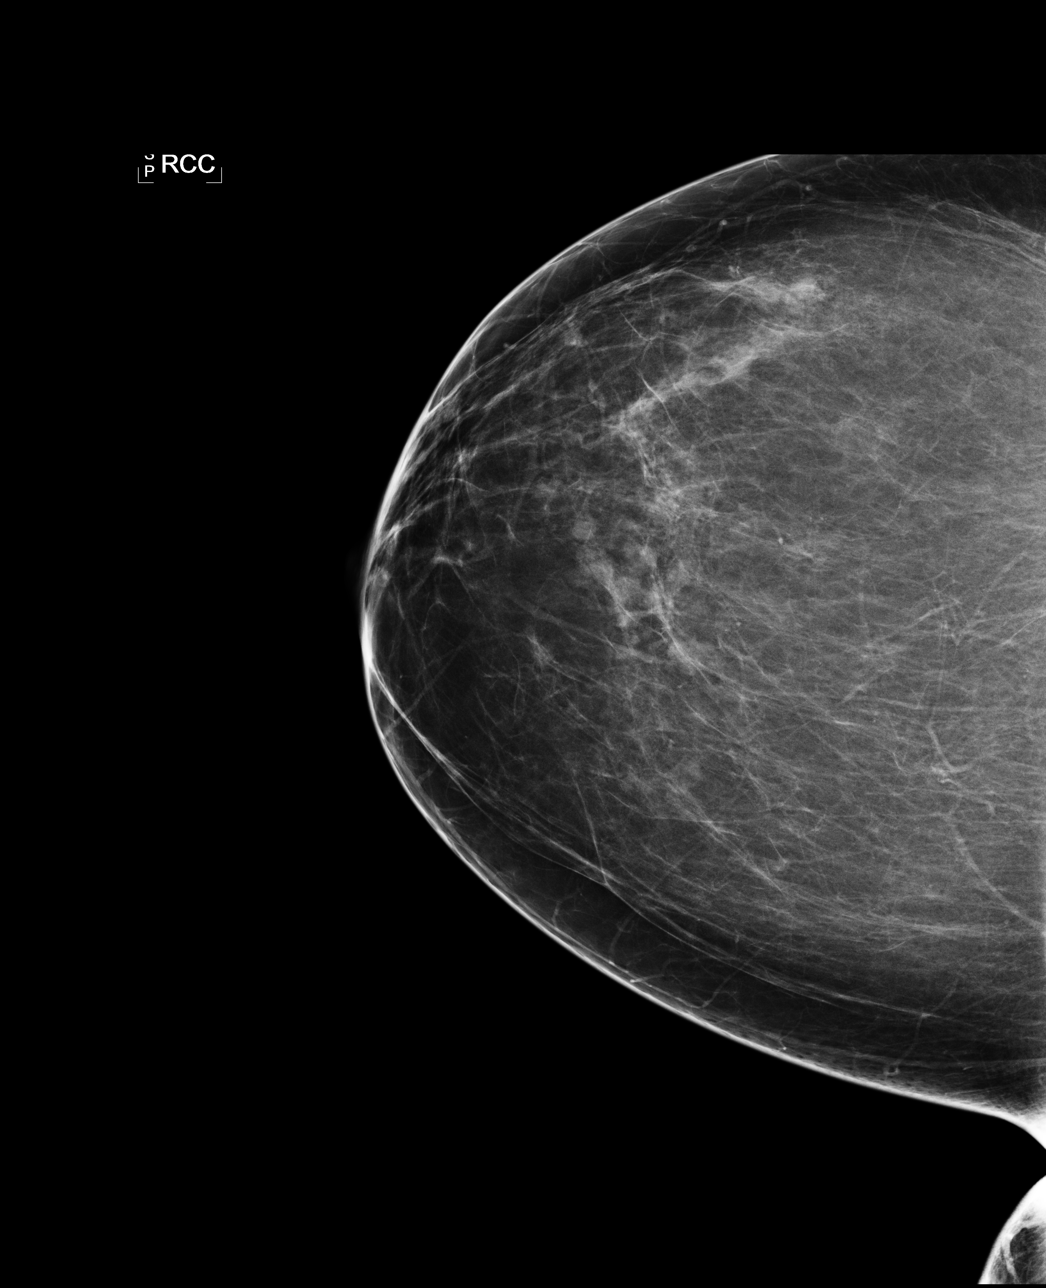

[L CC]
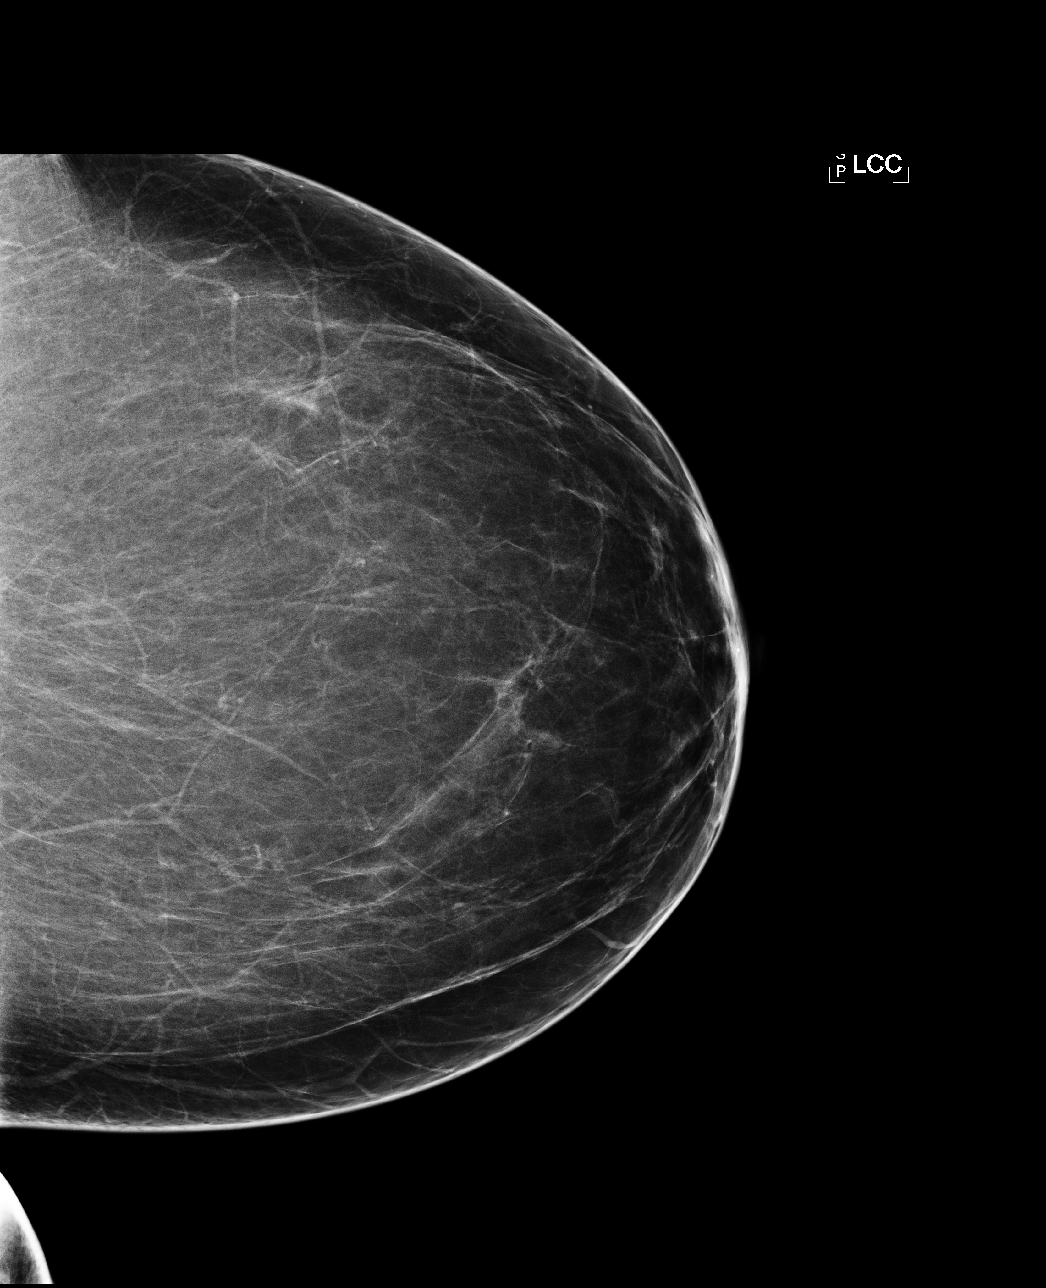

[L MLO]
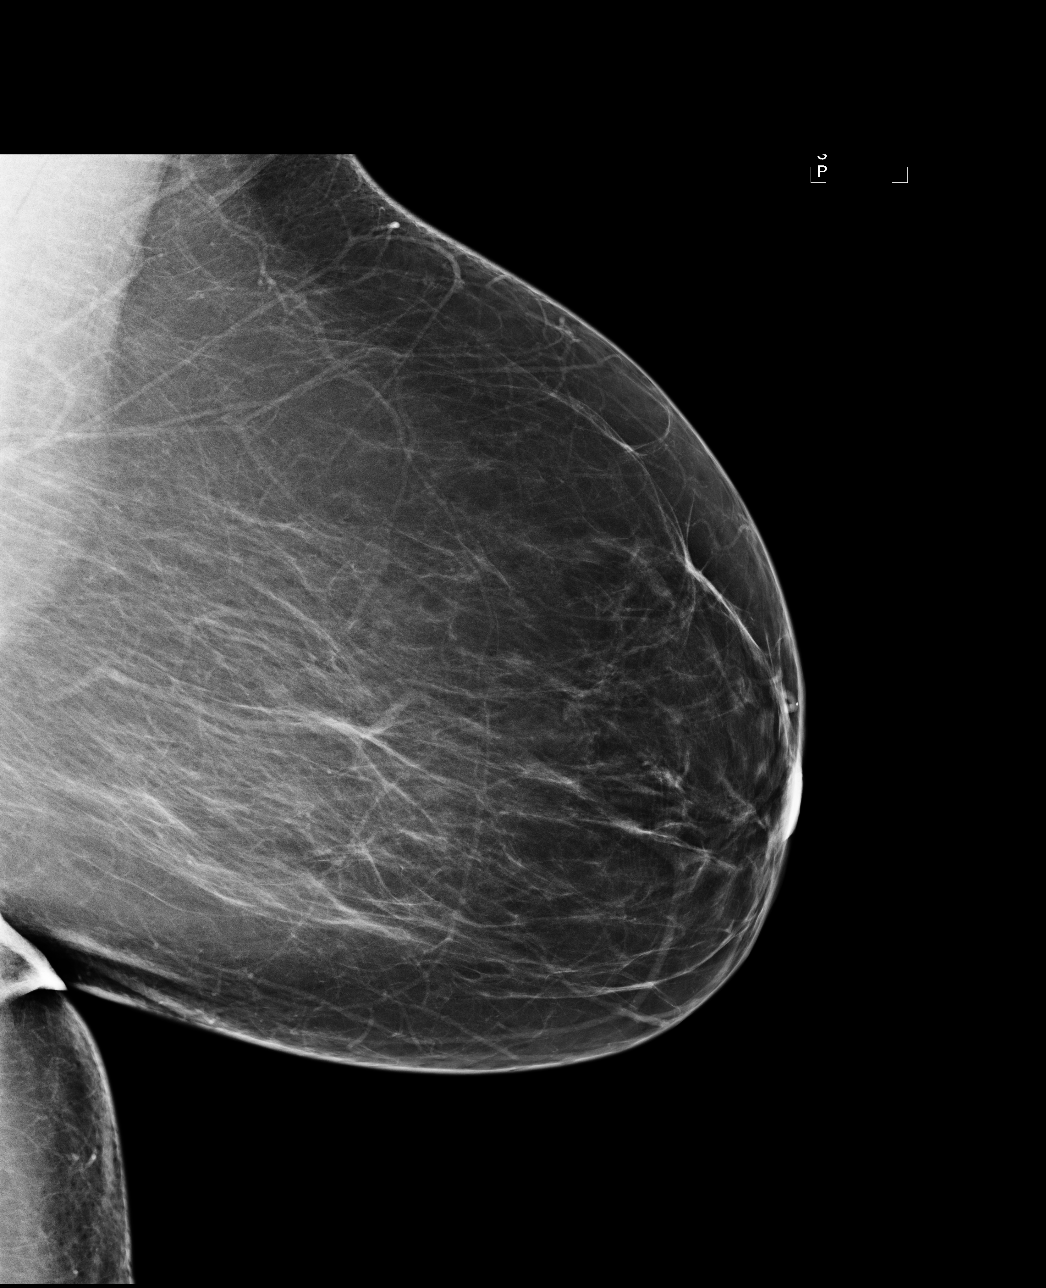

[R MLO]
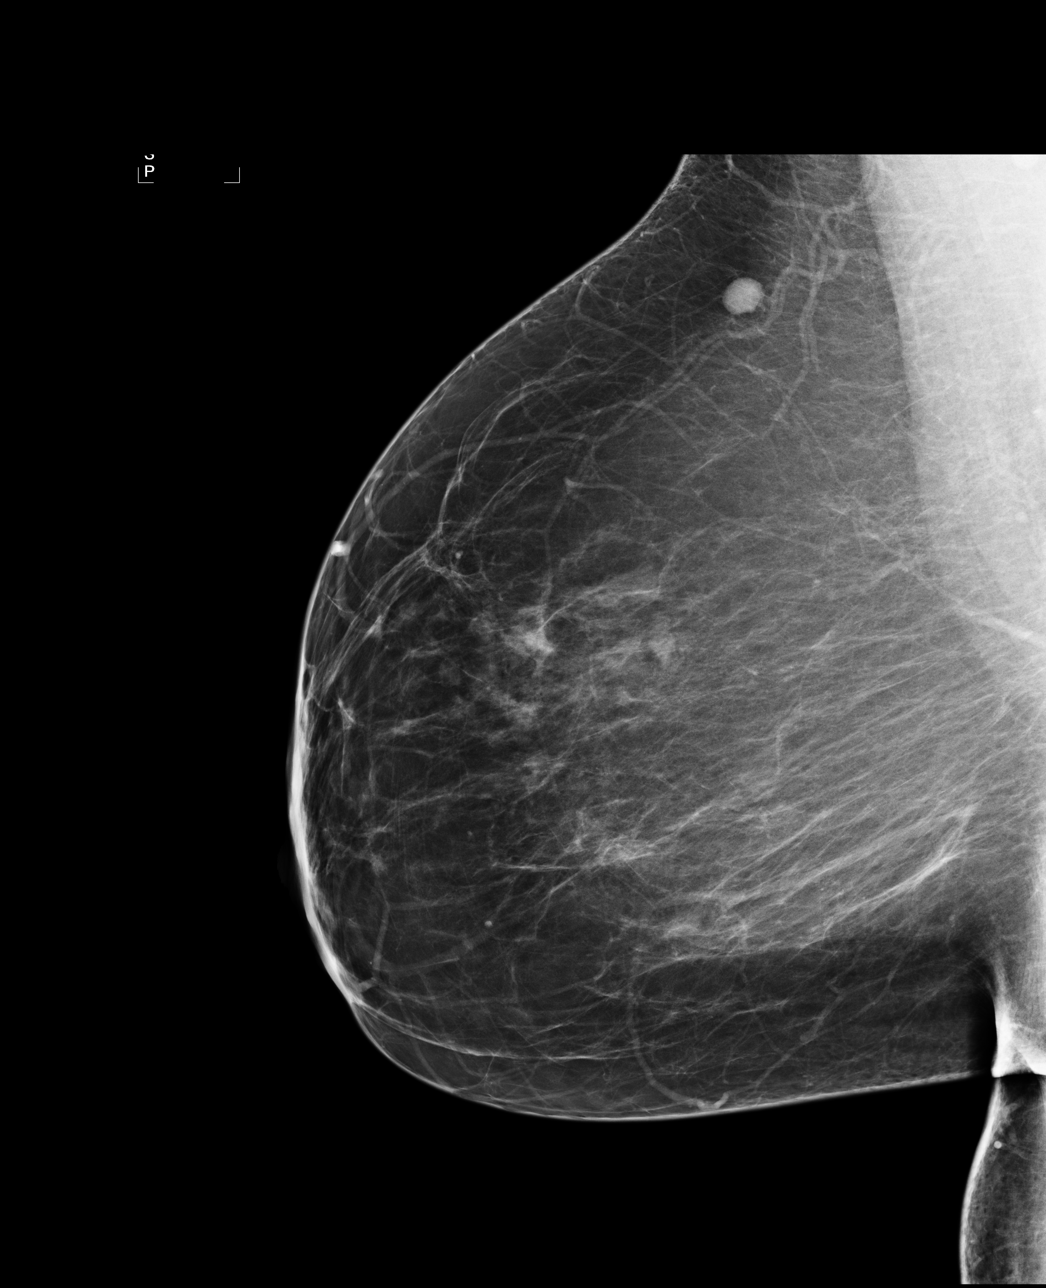

[4 of 4 positions shown; findings below may reference images not displayed]

ACR Breast Density Category b: There are scattered areas of
fibroglandular density.
FINDINGS: There are no findings suspicious for malignancy. Images were
processed with CAD.
IMPRESSION: No mammographic evidence of malignancy. A result letter of this
screening mammogram will be mailed directly to the patient.

RECOMMENDATION:
Screening mammogram in one year. (Code:AS-G-LCT)

BI-RADS CATEGORY  1: Negative.

## 2015-03-26 ENCOUNTER — Ambulatory Visit: Payer: Self-pay | Attending: Internal Medicine

## 2015-04-03 ENCOUNTER — Encounter: Payer: Self-pay | Admitting: Internal Medicine

## 2015-04-03 ENCOUNTER — Ambulatory Visit: Payer: Self-pay | Attending: Internal Medicine | Admitting: Internal Medicine

## 2015-04-03 VITALS — BP 120/77 | HR 92 | Temp 98.9°F | Resp 16 | Ht 60.0 in | Wt 214.0 lb

## 2015-04-03 DIAGNOSIS — Z23 Encounter for immunization: Secondary | ICD-10-CM | POA: Insufficient documentation

## 2015-04-03 DIAGNOSIS — I10 Essential (primary) hypertension: Secondary | ICD-10-CM | POA: Insufficient documentation

## 2015-04-03 DIAGNOSIS — Z0000 Encounter for general adult medical examination without abnormal findings: Secondary | ICD-10-CM

## 2015-04-03 DIAGNOSIS — Z803 Family history of malignant neoplasm of breast: Secondary | ICD-10-CM | POA: Insufficient documentation

## 2015-04-03 DIAGNOSIS — Z79899 Other long term (current) drug therapy: Secondary | ICD-10-CM | POA: Insufficient documentation

## 2015-04-03 DIAGNOSIS — E669 Obesity, unspecified: Secondary | ICD-10-CM | POA: Insufficient documentation

## 2015-04-03 DIAGNOSIS — Z6841 Body Mass Index (BMI) 40.0 and over, adult: Secondary | ICD-10-CM | POA: Insufficient documentation

## 2015-04-03 NOTE — Progress Notes (Signed)
Patient ID: Marie MenghiniLisa D Brownlee, female   DOB: 1966-02-01, 49 y.o.   MRN: [id number] Subjective:  Marie MenghiniLisa D Dooly is a 49 y.o. female with hypertension and obesity. Reports that she was at the plasma center and was told that she could not donate blood because her hematocrit was too low. She does report lack of energy. Patient reports that she has been off her blood pressure medication for about 9 months. She checks her pressures and all have been below 132/80.  Current Outpatient Prescriptions  Medication Sig Dispense Refill  . ibuprofen (ADVIL,MOTRIN) 200 MG tablet Take 400 mg by mouth every 6 (six) hours as needed for moderate pain.    Marland Kitchen. lisinopril-hydrochlorothiazide (PRINZIDE,ZESTORETIC) 20-25 MG per tablet Take 1 tablet by mouth daily. (Patient not taking: Reported on 04/03/2015) 30 tablet 5  . Multiple Vitamin (MULTIVITAMIN) capsule Take 1 capsule by mouth daily.     No current facility-administered medications for this visit.    Hypertension ROS: taking medications as instructed, no medication side effects noted, no TIA's, no chest pain on exertion, no dyspnea on exertion and no swelling of ankles.  New concerns: Wants referral to have mammogram. States that her older sister was just diagnosed with breast cancer last week.   Objective:  BP 120/77 mmHg  Pulse 92  Temp(Src) 98.9 F (37.2 C) (Oral)  Resp 16  Ht 5' (1.524 m)  Wt 214 lb (97.07 kg)  BMI 41.79 kg/m2  SpO2 99%  LMP 03/26/2015  Appearance alert, well appearing, and in no distress, oriented to person, place, and time and overweight. General exam BP noted to be well controlled today in office, S1, S2 normal, no gallop, no murmur, chest clear, no JVD, no HSM, no edema.  Lab review: orders written for new lab studies as appropriate; see orders.   Assessment:   Hypertension well controlled and may continue to control with diet and exercise alone. Will not resume BP medication at this time. I will schedule her for a physical to do breast  exam, blood, and pap.  Will check CBC and decide if she can donate plasma.  Influenza vaccine received in office today.   Plan:  Current treatment plan is effective, no change in therapy. Reviewed diet, exercise and weight control. Recommended sodium restriction..  Return for physical soon.  Ambrose FinlandValerie A Keck, NP 04/09/2015 2:20 PM

## 2015-04-03 NOTE — Progress Notes (Signed)
F/U HTN Requesting mammogram referral  No pain  No hx tobacco

## 2015-04-11 ENCOUNTER — Ambulatory Visit: Payer: Self-pay | Attending: Internal Medicine | Admitting: Internal Medicine

## 2015-04-11 ENCOUNTER — Encounter: Payer: Self-pay | Admitting: Internal Medicine

## 2015-04-11 VITALS — BP 136/83 | HR 85 | Temp 98.0°F | Resp 16 | Ht 60.0 in | Wt 214.0 lb

## 2015-04-11 DIAGNOSIS — I10 Essential (primary) hypertension: Secondary | ICD-10-CM | POA: Insufficient documentation

## 2015-04-11 DIAGNOSIS — Z0000 Encounter for general adult medical examination without abnormal findings: Secondary | ICD-10-CM | POA: Insufficient documentation

## 2015-04-11 DIAGNOSIS — Z79899 Other long term (current) drug therapy: Secondary | ICD-10-CM | POA: Insufficient documentation

## 2015-04-11 DIAGNOSIS — D649 Anemia, unspecified: Secondary | ICD-10-CM | POA: Insufficient documentation

## 2015-04-11 DIAGNOSIS — Z803 Family history of malignant neoplasm of breast: Secondary | ICD-10-CM | POA: Insufficient documentation

## 2015-04-11 DIAGNOSIS — Z1239 Encounter for other screening for malignant neoplasm of breast: Secondary | ICD-10-CM

## 2015-04-11 LAB — COMPLETE METABOLIC PANEL WITH GFR
ALT: 9 U/L (ref 6–29)
AST: 16 U/L (ref 10–35)
Albumin: 3.8 g/dL (ref 3.6–5.1)
Alkaline Phosphatase: 59 U/L (ref 33–130)
BILIRUBIN TOTAL: 0.3 mg/dL (ref 0.2–1.2)
BUN: 8 mg/dL (ref 7–25)
CO2: 28 mmol/L (ref 20–31)
CREATININE: 0.69 mg/dL (ref 0.50–1.05)
Calcium: 9.4 mg/dL (ref 8.6–10.4)
Chloride: 102 mmol/L (ref 98–110)
GFR, Est African American: >89 mL/min (ref >=60)
GFR, Est Non African American: >89 mL/min (ref >=60)
Glucose, Bld: 99 mg/dL (ref 65–99)
Potassium: 4.8 mmol/L (ref 3.5–5.3)
Sodium: 134 mmol/L — ABNORMAL LOW (ref 135–146)
TOTAL PROTEIN: 6.9 g/dL (ref 6.1–8.1)

## 2015-04-11 LAB — POCT URINALYSIS DIPSTICK
BILIRUBIN UA: NEGATIVE
GLUCOSE UA: NEGATIVE
Ketones, UA: NEGATIVE
Leukocytes, UA: NEGATIVE
Nitrite, UA: NEGATIVE
Protein, UA: NEGATIVE
UROBILINOGEN UA: 0.2
pH, UA: 6

## 2015-04-11 LAB — CBC WITH DIFFERENTIAL/PLATELET
BASOS ABS: 0.1 10*3/uL (ref 0.0–0.1)
Basophils Relative: 1 % (ref 0–1)
EOS PCT: 1 % (ref 0–5)
Eosinophils Absolute: 0.1 10*3/uL (ref 0.0–0.7)
HCT: 34 % — ABNORMAL LOW (ref 36.0–46.0)
HEMOGLOBIN: 10.5 g/dL — AB (ref 12.0–15.0)
Lymphocytes Relative: 31 % (ref 12–46)
Lymphs Abs: 2.3 10*3/uL (ref 0.7–4.0)
MCH: 23.4 pg — ABNORMAL LOW (ref 26.0–34.0)
MCHC: 30.9 g/dL (ref 30.0–36.0)
MCV: 75.7 fL — AB (ref 78.0–100.0)
MPV: 8.7 fL (ref 8.6–12.4)
Monocytes Absolute: 0.6 10*3/uL (ref 0.1–1.0)
Monocytes Relative: 8 % (ref 3–12)
NEUTROS PCT: 59 % (ref 43–77)
Neutro Abs: 4.3 10*3/uL (ref 1.7–7.7)
PLATELETS: 434 10*3/uL — AB (ref 150–400)
RBC: 4.49 MIL/uL (ref 3.87–5.11)
RDW: 17.2 % — ABNORMAL HIGH (ref 11.5–15.5)
WBC: 7.3 10*3/uL (ref 4.0–10.5)

## 2015-04-11 LAB — LIPID PANEL
CHOL/HDL RATIO: 2.6 ratio (ref <=5.0)
Cholesterol: 177 mg/dL (ref 125–200)
HDL: 68 mg/dL (ref >=46)
LDL Cholesterol: 95 mg/dL (ref <130)
Triglycerides: 71 mg/dL (ref <150)
VLDL: 14 mg/dL (ref <30)

## 2015-04-11 NOTE — Progress Notes (Signed)
Patient ID: AKIMA SLAUGH, female   DOB: [birth year], 49 y.o.   MRN: [id number]  CC: physical  HPI: Kristin Barcus is a 49 y.o. female here today for a follow up visit.  Patient has past medical history of anemia and HTN (controlled with diet and exercise). Patient is not on any current medications and has no updates on medical history. Patient reports that her sister was diagnosed with breast cancer 2 weeks ago and she would like to have a physical. She has no complaints today.   No Known Allergies Past Medical History  Diagnosis Date  . Breast pain   . Anemia   . HTN (hypertension)   . Menorrhagia    Current Outpatient Prescriptions on File Prior to Visit  Medication Sig Dispense Refill  . ibuprofen (ADVIL,MOTRIN) 200 MG tablet Take 400 mg by mouth every 6 (six) hours as needed for moderate pain.    Marland Kitchen lisinopril-hydrochlorothiazide (PRINZIDE,ZESTORETIC) 20-25 MG per tablet Take 1 tablet by mouth daily. (Patient not taking: Reported on 04/03/2015) 30 tablet 5  . Multiple Vitamin (MULTIVITAMIN) capsule Take 1 capsule by mouth daily.     No current facility-administered medications on file prior to visit.   Family History  Problem Relation Age of Onset  . Breast cancer Maternal Grandmother    Social History   Social History  . Marital Status: Legally Separated    Spouse Name: N/A  . Number of Children: 3  . Years of Education: N/A   Occupational History  .      [employer]   Social History Main Topics  . Smoking status: Never Smoker   . Smokeless tobacco: Not on file  . Alcohol Use: No  . Drug Use: No  . Sexual Activity: No   Other Topics Concern  . Not on file   Social History Narrative    Review of Systems: Constitutional: Negative for fever, chills, diaphoresis, activity change, appetite change and fatigue. HENT: Negative for ear pain, nosebleeds, congestion, facial swelling, rhinorrhea, neck pain, neck stiffness and ear discharge.  Eyes: Negative for pain, discharge,  redness, itching and visual disturbance. Respiratory: Negative for cough, choking, chest tightness, shortness of breath, wheezing and stridor.  Cardiovascular: Negative for chest pain, palpitations and leg swelling. Gastrointestinal: Negative for abdominal distention. Genitourinary: Negative for dysuria, urgency, frequency, hematuria, flank pain, decreased urine volume, difficulty urinating and dyspareunia.  Musculoskeletal: Negative for back pain, joint swelling, arthralgias and gait problem. Neurological: Negative for dizziness, tremors, seizures, syncope, facial asymmetry, speech difficulty, weakness, light-headedness, numbness and headaches.  Hematological: Negative for adenopathy. Does not bruise/bleed easily. Psychiatric/Behavioral: Negative for hallucinations, behavioral problems, confusion, dysphoric mood, decreased concentration and agitation.    Objective:   Filed Vitals:   04/11/15 1417  BP: 136/83  Pulse: 85  Temp: 98 F (36.7 C)  Resp: 16    Physical Exam: Constitutional: Patient appears well-developed and well-nourished. No distress. HENT: Normocephalic, atraumatic, External right and left ear normal. Oropharynx is clear and moist.  Eyes: Conjunctivae and EOM are normal. PERRLA, no scleral icterus. Neck: Normal ROM. Neck supple. No JVD. No tracheal deviation. No thyromegaly. CVS: RRR, S1/S2 +, no murmurs, no gallops, no carotid bruit.  Pulmonary: Effort and breath sounds normal, no stridor, rhonchi, wheezes, rales.  Abdominal: Soft. BS +,  no distension, tenderness, rebound or guarding.  Musculoskeletal: Normal range of motion. No edema and no tenderness.  Lymphadenopathy: No lymphadenopathy noted, cervical, inguinal or axillary Neuro: Alert. Normal reflexes, muscle tone coordination. No cranial nerve  deficit. Skin: Skin is warm and dry. No rash noted. Not diaphoretic. No erythema. No pallor. Psychiatric: Normal mood and affect. Behavior, judgment, thought content  normal. Genitalia: Normal female without lesion, discharge or tenderness, NSSA, NT, no adnexal masses felt on exam  Breast: No tenderness, masses, or nipple abnormality     Lab Results  Component Value Date   WBC 6.3 12/14/2013   HGB 11.4* 12/14/2013   HCT 34.5* 12/14/2013   MCV 81.8 12/14/2013   PLT 376 12/14/2013   Lab Results  Component Value Date   CREATININE 0.71 12/14/2013   BUN 9 12/14/2013   NA 135* 12/14/2013   K 4.1 12/14/2013   CL 98 12/14/2013   CO2 23 12/14/2013    Lab Results  Component Value Date   HGBA1C 6.3* 01/10/2013   Lipid Panel     Component Value Date/Time   CHOL 179 01/10/2013 1007   TRIG 74 01/10/2013 1007   HDL 50 01/10/2013 1007   CHOLHDL 3.6 01/10/2013 1007   VLDL 15 01/10/2013 1007   LDLCALC 114* 01/10/2013 1007       Assessment and plan:   Misty StanleyLisa was seen today for annual exam.  Diagnoses and all orders for this visit:  Annual physical exam -     Lipid panel -     CBC with Differential -     COMPLETE METABOLIC PANEL WITH GFR -     POCT urinalysis dipstick -     Cytology - PAP [place] Gave her number to apply for mammogram scholarship.      Return in about 6 months (around 10/09/2015) for routine-BP check.   Ambrose FinlandValerie A Keck, NP-C [facility] [phone] 04/11/2015, 2:45 PM

## 2015-04-11 NOTE — Progress Notes (Signed)
Patient here for her physical and blood work Patient currently not taking any prescribed medications

## 2015-04-12 ENCOUNTER — Other Ambulatory Visit: Payer: Self-pay | Admitting: Internal Medicine

## 2015-04-12 ENCOUNTER — Telehealth: Payer: Self-pay

## 2015-04-12 DIAGNOSIS — D508 Other iron deficiency anemias: Secondary | ICD-10-CM

## 2015-04-12 LAB — CERVICOVAGINAL ANCILLARY ONLY
Chlamydia: NEGATIVE
Neisseria Gonorrhea: NEGATIVE
TRICH (WINDOWPATH): NEGATIVE

## 2015-04-12 MED ORDER — FERROUS SULFATE 325 (65 FE) MG PO TABS: 325.0000 mg | ORAL_TABLET | Freq: Every day | ORAL | Status: DC

## 2015-04-12 NOTE — Telephone Encounter (Signed)
Called patient this am in reference to her lab results Patient not available Left message on voice mail to return our call

## 2015-04-12 NOTE — Telephone Encounter (Signed)
-----   Message from Ambrose FinlandValerie A Keck, NP sent at 04/12/2015  8:45 AM EDT ----- She is anemic. I want her to take iron tablets once per day, I have sent order. Cholesterol has improved. Continue to work on diet and exercise especially fried foods and carbs.

## 2015-04-12 NOTE — Telephone Encounter (Signed)
Patient returned phone call, please f/u °

## 2015-04-15 ENCOUNTER — Telehealth: Payer: Self-pay

## 2015-04-15 LAB — CERVICOVAGINAL ANCILLARY ONLY: WET PREP (BD AFFIRM): NEGATIVE

## 2015-04-15 NOTE — Telephone Encounter (Signed)
Returned patient phone call Patient not available Left message on voice mail to return our call 

## 2015-04-16 LAB — CYTOLOGY - PAP

## 2015-04-17 ENCOUNTER — Telehealth: Payer: Self-pay

## 2015-04-17 NOTE — Telephone Encounter (Signed)
-----   Message from Ambrose FinlandValerie A Keck, NP sent at 04/17/2015  2:10 PM EST ----- Patient pap is negative for malignancies and infections. Will repeat in 3 years.

## 2015-04-17 NOTE — Telephone Encounter (Signed)
Spoke with patient and she of her recent pap results

## 2015-07-08 ENCOUNTER — Other Ambulatory Visit (HOSPITAL_COMMUNITY): Payer: Self-pay | Admitting: *Deleted

## 2015-07-08 DIAGNOSIS — N644 Mastodynia: Secondary | ICD-10-CM

## 2015-07-11 ENCOUNTER — Telehealth (HOSPITAL_COMMUNITY): Payer: Self-pay | Admitting: *Deleted

## 2015-07-11 NOTE — Telephone Encounter (Signed)
Telephoned patient at home # and left message on voicemail concerning B[facility]CP appointment

## 2015-07-12 ENCOUNTER — Ambulatory Visit (HOSPITAL_COMMUNITY)
Admission: RE | Admit: 2015-07-12 | Discharge: 2015-07-12 | Disposition: A | Discharge status: Home / Self Care | Payer: Self-pay | Source: Ambulatory Visit | Attending: Obstetrics and Gynecology | Admitting: Obstetrics and Gynecology

## 2015-07-12 ENCOUNTER — Encounter (HOSPITAL_COMMUNITY): Payer: Self-pay

## 2015-07-12 ENCOUNTER — Ambulatory Visit
Admission: RE | Admit: 2015-07-12 | Discharge: 2015-07-12 | Disposition: A | Discharge status: Home / Self Care | Payer: No Typology Code available for payment source | Source: Ambulatory Visit | Attending: Obstetrics and Gynecology | Admitting: Obstetrics and Gynecology

## 2015-07-12 VITALS — BP 150/92 | Temp 98.4°F | Ht 61.0 in | Wt 208.0 lb

## 2015-07-12 DIAGNOSIS — Z1239 Encounter for other screening for malignant neoplasm of breast: Secondary | ICD-10-CM

## 2015-07-12 DIAGNOSIS — N644 Mastodynia: Secondary | ICD-10-CM
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
## 2015-07-12 NOTE — Patient Instructions (Addendum)
Educational materials on self breast awareness given. Explained to Marie Garcia that she did not need a Pap smear today due to last Pap smear was 04/11/2015. Let her know [facility]CP will cover Pap smears and HPV typing every 5 years unless has a history of abnormal Pap smears. Referred patient to the [facility] for diagnostic mammogram. Appointment scheduled for Friday, July 12, 2015 at 1040. Patient aware of appointment and will be there. Marie Garcia verbalized understanding.  [place], Kathaleen Maser, RN 9:11 AM

## 2015-07-15 ENCOUNTER — Encounter (HOSPITAL_COMMUNITY): Payer: Self-pay | Admitting: *Deleted

## 2015-12-31 ENCOUNTER — Encounter: Payer: Self-pay | Admitting: Internal Medicine

## 2016-03-09 ENCOUNTER — Encounter: Payer: Self-pay | Admitting: Internal Medicine

## 2016-03-09 ENCOUNTER — Encounter (INDEPENDENT_AMBULATORY_CARE_PROVIDER_SITE_OTHER): Payer: Self-pay

## 2016-03-09 ENCOUNTER — Ambulatory Visit (INDEPENDENT_AMBULATORY_CARE_PROVIDER_SITE_OTHER): Payer: BLUE CROSS/BLUE SHIELD | Admitting: Internal Medicine

## 2016-03-09 VITALS — BP 140/90 | HR 77 | Ht 60.0 in | Wt 217.5 lb

## 2016-03-09 DIAGNOSIS — K625 Hemorrhage of anus and rectum: Secondary | ICD-10-CM

## 2016-03-09 DIAGNOSIS — D500 Iron deficiency anemia secondary to blood loss (chronic): Secondary | ICD-10-CM

## 2016-03-09 DIAGNOSIS — D649 Anemia, unspecified: Secondary | ICD-10-CM

## 2016-03-09 MED ORDER — NA SULFATE-K SULFATE-MG SULF 17.5-3.13-1.6 GM/177ML PO SOLN: 1.0000 | Freq: Once | ORAL | 0 refills | Status: AC

## 2016-03-09 NOTE — Patient Instructions (Signed)
If you are age 52 or older, your body mass index should be between 23-30. Your Body mass index is 42.48 kg/m. If this is out of the aforementioned range listed, please consider follow up with your Primary Care Provider.  If you are age 52 or younger, your body mass index should be between 19-25. Your Body mass index is 42.48 kg/m. If this is out of the aformentioned range listed, please consider follow up with your Primary Care Provider.   You have been scheduled for a EGD and colonoscopy. Please follow written instructions given to you at your visit today.  Please pick up your prep supplies at the pharmacy within the next 1-3 days. If you use inhalers (even only as needed), please bring them with you on the day of your procedure. Your physician has requested that you go to www.startemmi.com and enter the access code given to you at your visit today. This web site gives a general overview about your procedure. However, you should still follow specific instructions given to you by our office regarding your preparation for the procedure.

## 2016-03-09 NOTE — Progress Notes (Signed)
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, who is referred to the courtesy of Dr. Annamaria Hellingobert Wein with chief complaint of rectal bleeding and anemia. Patient denies prior GI history or GI evaluations. He reports blood mixed with her stool intermittently for several months. No abdominal pain, change in bowel habits, weight loss, or family history of colon cancer. She still menstruates. She does have anemia hemoglobin 10.9 and MCV 83. Displaced on iron at one point. No longer on iron. Had been on NSAIDs. But no longer. She denies upper GI complaints  REVIEW OF SYSTEMS:  All non-GI ROS negative except for menorrhagia  Past Medical History:  Diagnosis Date  . Anemia   . Breast pain   . Hemorrhoid   . HTN (hypertension)   . Menorrhagia     Past Surgical History:  Procedure Laterality Date  . TONSILLECTOMY      Social History Marie Garcia  reports that she has never smoked. She has never used smokeless tobacco. She reports that she does not drink alcohol or use drugs.  family history includes Breast cancer in her maternal grandmother and sister.  No Known Allergies     PHYSICAL EXAMINATION: Vital signs: BP 140/90   Pulse 77   Ht 5' (1.524 m)   Wt 217 lb 8 oz (98.7 kg)   SpO2 97%   BMI 42.48 kg/m   Constitutional: generally well-appearing, no acute distress Psychiatric: alert and oriented x3, cooperative Eyes: extraocular movements intact, anicteric, conjunctiva pink Mouth: oral pharynx moist, no lesions Neck: supple no lymphadenopathy Cardiovascular: heart regular rate and rhythm, no murmur Lungs: clear to auscultation bilaterally Abdomen: soft, nontender, nondistended, no obvious ascites, no peritoneal signs, normal bowel sounds, no organomegaly Rectal:Deferred until colonoscopy Extremities: no clubbing cyanosis or lower extremity edema bilaterally Skin: no lesions on visible extremities Neuro: No focal deficits. Normal  DTRs. Cranial nerves intact  ASSESSMENT:  #1. Intermittent rectal bleeding. Rule out neoplasia  #2. Microcytic anemia. Likely iron deficiency #3. Menorrhagia  PLAN:  #1. Colonoscopy and upper endoscopy to evaluate iron deficiency anemia, rectal bleeding, and provide neoplasia screening.The nature of the procedure, as well as the risks, benefits, and alternatives were carefully and thoroughly reviewed with the patient. Ample time for discussion and questions allowed. The patient understood, was satisfied, and agreed to proceed.

## 2016-03-25 ENCOUNTER — Encounter: Payer: Self-pay | Admitting: Internal Medicine

## 2016-03-31 ENCOUNTER — Telehealth: Payer: Self-pay | Admitting: Internal Medicine

## 2016-04-02 NOTE — Telephone Encounter (Signed)
Left suprep sample up front to be picked up

## 2016-04-07 ENCOUNTER — Encounter: Payer: Self-pay | Admitting: Internal Medicine

## 2016-04-07 ENCOUNTER — Ambulatory Visit (INDEPENDENT_AMBULATORY_CARE_PROVIDER_SITE_OTHER): Payer: BLUE CROSS/BLUE SHIELD | Admitting: Internal Medicine

## 2016-04-07 VITALS — BP 149/80 | HR 72 | Temp 98.4°F | Resp 18 | Ht 60.0 in | Wt 217.0 lb

## 2016-04-07 DIAGNOSIS — K253 Acute gastric ulcer without hemorrhage or perforation: Secondary | ICD-10-CM | POA: Diagnosis not present

## 2016-04-07 DIAGNOSIS — D500 Iron deficiency anemia secondary to blood loss (chronic): Secondary | ICD-10-CM | POA: Diagnosis not present

## 2016-04-07 DIAGNOSIS — K2980 Duodenitis without bleeding: Secondary | ICD-10-CM

## 2016-04-07 DIAGNOSIS — K449 Diaphragmatic hernia without obstruction or gangrene: Secondary | ICD-10-CM

## 2016-04-07 DIAGNOSIS — K625 Hemorrhage of anus and rectum: Secondary | ICD-10-CM | POA: Diagnosis not present

## 2016-04-07 DIAGNOSIS — K633 Ulcer of intestine: Secondary | ICD-10-CM

## 2016-04-07 DIAGNOSIS — K51911 Ulcerative colitis, unspecified with rectal bleeding: Secondary | ICD-10-CM | POA: Diagnosis not present

## 2016-04-07 DIAGNOSIS — K210 Gastro-esophageal reflux disease with esophagitis: Secondary | ICD-10-CM

## 2016-04-07 MED ORDER — OMEPRAZOLE 40 MG PO CPDR
40.0000 mg | DELAYED_RELEASE_CAPSULE | Freq: Every day | ORAL | 11 refills | Status: DC
Start: 2016-04-07 — End: 2018-05-07

## 2016-04-07 MED ORDER — SODIUM CHLORIDE 0.9 % IV SOLN: 500.0000 mL | INTRAVENOUS | Status: DC

## 2016-04-07 NOTE — Patient Instructions (Addendum)
YOU HAD AN ENDOSCOPIC PROCEDURE TODAY AT THE [facility]:   Refer to the procedure report that was given to you for any specific questions about what was found during the examination.  If the procedure report does not answer your questions, please call your gastroenterologist to clarify.  If you requested that your care partner not be given the details of your procedure findings, then the procedure report has been included in a sealed envelope for you to review at your convenience later.  YOU SHOULD EXPECT: Some feelings of bloating in the abdomen. Passage of more gas than usual.  Walking can help get rid of the air that was put into your GI tract during the procedure and reduce the bloating. If you had a lower endoscopy (such as a colonoscopy or flexible sigmoidoscopy) you may notice spotting of blood in your stool or on the toilet paper. If you underwent a bowel prep for your procedure, you may not have a normal bowel movement for a few days.  Please Note:  You might notice some irritation and congestion in your nose or some drainage.  This is from the oxygen used during your procedure.  There is no need for concern and it should clear up in a day or so.  SYMPTOMS TO REPORT IMMEDIATELY:   Following lower endoscopy (colonoscopy or flexible sigmoidoscopy):  Excessive amounts of blood in the stool  Significant tenderness or worsening of abdominal pains  Swelling of the abdomen that is new, acute  Fever of 100F or higher   Following upper endoscopy (EGD)  Vomiting of blood or coffee ground material  New chest pain or pain under the shoulder blades  Painful or persistently difficult swallowing  New shortness of breath  Fever of 100F or higher  Black, tarry-looking stools  For urgent or emergent issues, a gastroenterologist can be reached at any hour by calling [phone].   DIET:  We do recommend a small meal at first, but then you may proceed to your regular diet.  Drink  plenty of fluids but you should avoid alcoholic beverages for 24 hours.  Avoid NSAIDS, Ibuprofen, Goodys, Advil, Aleve. Start omeprazole 40mg  daily.  ACTIVITY:  You should plan to take it easy for the rest of today and you should NOT DRIVE or use heavy machinery until tomorrow (because of the sedation medicines used during the test).    FOLLOW UP: Our staff will call the number listed on your records the next business day following your procedure to check on you and address any questions or concerns that you may have regarding the information given to you following your procedure. If we do not reach you, we will leave a message.  However, if you are feeling well and you are not experiencing any problems, there is no need to return our call.  We will assume that you have returned to your regular daily activities without incident.  If any biopsies were taken you will be contacted by phone or by letter within the next 1-3 weeks.  Please call us at [phone] if you have not heard about the biopsies in 3 weeks.    SIGNATURES/CONFIDENTIALITY: You and/or your care partner have signed paperwork which will be entered into your electronic medical record.  These signatures attest to the fact that that the information above on your After Visit Summary has been reviewed and is understood.  Full responsibility of the confidentiality of this discharge information lies with you and/or your care-partner.  Thank you for letting us take care of your healthcare needs today.

## 2016-04-07 NOTE — Op Note (Signed)
[facility] Patient Name: Marie MountsLisa Boerema Procedure Date: 04/07/2016 1:38 PM MRN: [id number] Endoscopist: Wilhemina BonitoJohn N. Marina GoodellPerry , MD Age: [age] Referring MD:  Date of Birth: 1965-09-25 Gender: Female Account #: [id number] Procedure:                Colonoscopy, with biopsies Indications:              Rectal bleeding, Iron deficiency anemia Medicines:                Monitored Anesthesia Care Procedure:                Pre-Anesthesia Assessment:                           - Prior to the procedure, a History and Physical                            was performed, and patient medications and                            allergies were reviewed. The patient's tolerance of                            previous anesthesia was also reviewed. The risks                            and benefits of the procedure and the sedation                            options and risks were discussed with the patient.                            All questions were answered, and informed consent                            was obtained. Prior Anticoagulants: The patient has                            taken no previous anticoagulant or antiplatelet                            agents. ASA Grade Assessment: I - A normal, healthy                            patient. After reviewing the risks and benefits,                            the patient was deemed in satisfactory condition to                            undergo the procedure.                           After obtaining informed consent, the colonoscope  was passed under direct vision. Throughout the                            procedure, the patient's blood pressure, pulse, and                            oxygen saturations were monitored continuously. The                            Model CF-HQ190L [id number]) scope was introduced                            through the anus and advanced to the the cecum,                            identified by  appendiceal orifice and ileocecal                            valve. The terminal ileum, ileocecal valve,                            appendiceal orifice, and rectum were photographed.                            The quality of the bowel preparation was excellent.                            The colonoscopy was performed without difficulty.                            The patient tolerated the procedure well. The bowel                            preparation used was SUPREP. Scope In: 2:27:38 PM Scope Out: 2:45:48 PM Scope Withdrawal Time: 0 hours 15 minutes 35 seconds  Total Procedure Duration: 0 hours 18 minutes 10 seconds  Findings:                 The terminal ileum appeared normal.                           A few scattered nonspecific appearing erosions were                            found in the Cecum, right colon, and transverse                            colon. Biopsies were taken with a cold forceps for                            histology.                           Multiple diverticula were found in the left colon.  Internal hemorrhoids were found during retroflexion.                           The exam was otherwise without abnormality on                            direct and retroflexion views. Complications:            No immediate complications. Estimated blood loss:                            None. Estimated Blood Loss:     Estimated blood loss: none. Impression:               - The examined portion of the ileum was normal.                           - A few erosions in the colon. Suspect NSAID                            induced. Biopsied.                           - Diverticulosis in the left colon.                           - Internal hemorrhoids.                           - The examination was otherwise normal on direct                            and retroflexion views. Recommendation:           - Repeat colonoscopy in 10 years for screening                             purposes.                           - Avoid NSAIDs (medications such as Aleve,                            ibuprofen, Advil, BCs, Goody's, etc.).                           - take over-the-counter iron supplementation once                            daily.                           - Await pathology resultsarea Dr. Marina Goodell will send                            you a letter with the results. Wilhemina Bonito. Marina Goodell, MD 04/07/2016 3:00:25 PM This report has been signed electronically. CC Letter to:  Annamaria Helling, M.D.

## 2016-04-07 NOTE — Op Note (Signed)
[facility] Patient Name: Marie MountsLisa Garcia Procedure Date: 04/07/2016 1:36 PM MRN: [id number] Endoscopist: Wilhemina BonitoJohn N. Marina GoodellPerry , MD Age: 50 Referring MD:  Date of Birth: 1965/06/21 Gender: Female Account #: [id number] Procedure:                Upper GI endoscopy, with biopsies Indications:              Iron deficiency anemia Medicines:                Monitored Anesthesia Care Procedure:                Pre-Anesthesia Assessment:                           - Prior to the procedure, a History and Physical                            was performed, and patient medications and                            allergies were reviewed. The patient's tolerance of                            previous anesthesia was also reviewed. The risks                            and benefits of the procedure and the sedation                            options and risks were discussed with the patient.                            All questions were answered, and informed consent                            was obtained. Prior Anticoagulants: The patient has                            taken no previous anticoagulant or antiplatelet                            agents. ASA Grade Assessment: I - A normal, healthy                            patient. After reviewing the risks and benefits,                            the patient was deemed in satisfactory condition to                            undergo the procedure.                           After obtaining informed consent, the endoscope was  passed under direct vision. Throughout the                            procedure, the patient's blood pressure, pulse, and                            oxygen saturations were monitored continuously. The                            Model GIF-[id number]) scope was introduced                            through the mouth, and advanced to the second part                            of duodenum. The upper GI  endoscopy was                            accomplished without difficulty. The patient                            tolerated the procedure well. Scope In: Scope Out: Findings:                 LA Grade A (one or more mucosal breaks less than 5                            mm, not extending between tops of 2 mucosal folds)                            esophagitis was found.                           The exam of the esophagus was otherwise normal.                           One non-bleeding cratered gastric ulcer with no                            stigmata of bleeding was found in the prepyloric                            region of the stomach. The lesion was 7 mm in                            largest dimension. Biopsies were taken with a cold                            forceps for Helicobacter pylori testing using                            CLOtest.                           A  medium-sized hiatal hernia was present.                           The exam of the stomach was otherwise normal, on                            direct and retroflexed view.                           A few erosions without bleeding were found in the                            duodenal bulb.                           The exam of the duodenum was otherwise normal. Complications:            No immediate complications. Estimated Blood Loss:     Estimated blood loss: none. Impression:               - LA Grade A reflux esophagitis.                           - Non-bleeding gastric ulcer with no stigmata of                            bleeding. Biopsied.                           - Medium-sized hiatal hernia.                           - Duodenal erosions without bleeding. Recommendation:           - Prescribe omeprazole 40 mg daily; #30; 11 refills.                           - Resume previous diet.                           - Take once daily over-the-counter iron supplement                            to help your blood counts.                            - No aspirin, ibuprofen, naproxen, or other                            non-steroidal anti-inflammatory drugs.                           - Await pathology results.                           - Make follow-up appointment with Dr. Marina Goodell in  about 8 weeks Wilhemina Bonito. Marina Goodell, MD 04/07/2016 3:06:49 PM This report has been signed electronically. CC Letter to:             Annamaria Helling, M.D.

## 2016-04-07 NOTE — Progress Notes (Signed)
Called to room to assist during endoscopic procedure.  Patient ID and intended procedure confirmed with present staff. Received instructions for my participation in the procedure from the performing physician.  

## 2016-04-07 NOTE — Progress Notes (Signed)
A and O x3. Report to RN. Tolerated MAC anesthesia well.Teeth unchanged after procedure. 

## 2016-04-08 ENCOUNTER — Telehealth: Payer: Self-pay | Admitting: *Deleted

## 2016-04-08 LAB — HELICOBACTER PYLORI SCREEN-BIOPSY: UREASE: NEGATIVE

## 2016-04-08 NOTE — Telephone Encounter (Signed)
  Follow up Call-  Call back number 04/07/2016  Post procedure Call Back phone  # [phone]  Permission to leave phone message Yes  Some recent data might be hidden     Patient questions:  Message left to call us if necessary.

## 2016-04-09 ENCOUNTER — Encounter: Payer: Self-pay | Admitting: Internal Medicine

## 2016-05-22 ENCOUNTER — Ambulatory Visit: Payer: BLUE CROSS/BLUE SHIELD | Admitting: Internal Medicine

## 2016-07-06 ENCOUNTER — Ambulatory Visit: Payer: BLUE CROSS/BLUE SHIELD | Admitting: Internal Medicine

## 2016-12-21 ENCOUNTER — Other Ambulatory Visit: Payer: Self-pay | Admitting: Obstetrics & Gynecology

## 2016-12-21 DIAGNOSIS — Z1231 Encounter for screening mammogram for malignant neoplasm of breast: Secondary | ICD-10-CM

## 2016-12-22 ENCOUNTER — Ambulatory Visit
Admission: RE | Admit: 2016-12-22 | Discharge: 2016-12-22 | Disposition: A | Discharge status: Home / Self Care | Payer: BLUE CROSS/BLUE SHIELD | Source: Ambulatory Visit | Attending: Obstetrics & Gynecology | Admitting: Obstetrics & Gynecology

## 2016-12-22 DIAGNOSIS — Z1231 Encounter for screening mammogram for malignant neoplasm of breast: Secondary | ICD-10-CM

## 2018-04-06 ENCOUNTER — Encounter (HOSPITAL_COMMUNITY): Payer: Self-pay

## 2018-04-06 ENCOUNTER — Other Ambulatory Visit: Payer: Self-pay

## 2018-04-06 ENCOUNTER — Ambulatory Visit (HOSPITAL_COMMUNITY)
Admission: EM | Admit: 2018-04-06 | Discharge: 2018-04-06 | Disposition: A | Discharge status: Home / Self Care | Payer: BLUE CROSS/BLUE SHIELD | Attending: Family Medicine | Admitting: Family Medicine

## 2018-04-06 DIAGNOSIS — K0889 Other specified disorders of teeth and supporting structures: Secondary | ICD-10-CM

## 2018-04-06 DIAGNOSIS — R220 Localized swelling, mass and lump, head: Secondary | ICD-10-CM

## 2018-04-06 MED ORDER — MELOXICAM 7.5 MG PO TABS: 7.5000 mg | ORAL_TABLET | Freq: Every day | ORAL | 0 refills | Status: DC

## 2018-04-06 MED ORDER — CHLORHEXIDINE GLUCONATE 0.12 % MT SOLN
15.0000 mL | Freq: Two times a day (BID) | OROMUCOSAL | 0 refills | Status: DC
Start: 2018-04-06 — End: 2018-05-07

## 2018-04-06 MED ORDER — AMOXICILLIN-POT CLAVULANATE 875-125 MG PO TABS: 1.0000 | ORAL_TABLET | Freq: Two times a day (BID) | ORAL | 0 refills | Status: DC

## 2018-04-06 NOTE — ED Triage Notes (Signed)
Pt has right side facial swelling this started today. Pt states it si sore.

## 2018-04-06 NOTE — ED Provider Notes (Signed)
[facility]    CSN: [id number] Arrival date & time: 04/06/18  1601     History   Chief Complaint Chief Complaint  Patient presents with  . Abscess    HPI Marie Garcia is a 52 y.o. female.   52 year old female comes in for 1 day history of right facial swelling and dental pain.  States woke up this morning with the swelling, that is around the right lower jaw.  She has a known cracked tooth to the bottom right jaw, where a filling fell out about a month ago.  She denies any fever, chills, night sweats.  Denies swelling of the throat, trouble swallowing, trouble breathing, trismus.  Has not taken anything for the symptoms.     Past Medical History:  Diagnosis Date  . Anemia   . Breast pain   . Hemorrhoid   . HTN (hypertension)   . Menorrhagia     Patient Active Problem List   Diagnosis Date Noted  . Chest pain 02/07/2013  . Fatigue 02/07/2013  . Morbid obesity (HCC) 02/07/2013  . Breast pain   . Anemia   . HTN (hypertension)     Past Surgical History:  Procedure Laterality Date  . TONSILLECTOMY      OB History    Gravida  3   Para  3   Term  3   Preterm      AB      Living  3     SAB      TAB      Ectopic      Multiple      Live Births               Home Medications    Prior to Admission medications   Medication Sig Start Date End Date Taking? Authorizing Provider  amoxicillin-clavulanate (AUGMENTIN) 875-125 MG tablet Take 1 tablet by mouth every 12 (twelve) hours. 04/06/18   Cathie Hoops, Maxemiliano Riel V, PA-C  chlorhexidine (PERIDEX) 0.12 % solution Use as directed 15 mLs in the mouth or throat 2 (two) times daily. 04/06/18   Cathie Hoops, Kareli Hossain V, PA-C  meloxicam (MOBIC) 7.5 MG tablet Take 1 tablet (7.5 mg total) by mouth daily. 04/06/18   Cathie Hoops, Dondrell Loudermilk V, PA-C  Multiple Vitamin (MULTIVITAMIN) capsule Take 1 capsule by mouth daily. Reported on 07/12/2015    [provider]  omeprazole (PRILOSEC) 40 MG capsule Take 1 capsule (40 mg total) by mouth  daily. 04/07/16   Hilarie Fredrickson, MD    Family History Family History  Problem Relation Age of Onset  . Breast cancer Maternal Grandmother   . Breast cancer Sister     Social History Social History   Tobacco Use  . Smoking status: Never Smoker  . Smokeless tobacco: Never Used  Substance Use Topics  . Alcohol use: No  . Drug use: No     Allergies   Patient has no known allergies.   Review of Systems Review of Systems  Reason unable to perform ROS: See HPI as above.     Physical Exam Triage Vital Signs ED Triage Vitals  Enc Vitals Group     BP 04/06/18 1716 (!) 144/76     Pulse Rate 04/06/18 1716 97     Resp 04/06/18 1716 18     Temp 04/06/18 1716 98.8 F (37.1 C)     Temp Source 04/06/18 1716 Oral     SpO2 04/06/18 1716 100 %     Weight 04/06/18  1717 163 lb (73.9 kg)     Height --      Head Circumference --      Peak Flow --      Pain Score 04/06/18 1717 6     Pain Loc --      Pain Edu? --      Excl. in [place]? --    No data found.  Updated Vital Signs BP (!) 144/76 (BP Location: Right Arm)   Pulse 97   Temp 98.8 F (37.1 C) (Oral)   Resp 18   Wt 163 lb (73.9 kg)   LMP 02/02/2018   SpO2 100%   BMI 31.83 kg/m   Physical Exam  Constitutional: She is oriented to person, place, and time. She appears well-developed and well-nourished. No distress.  HENT:  Head: Normocephalic and atraumatic.  Mouth/Throat: Uvula is midline, oropharynx is clear and moist and mucous membranes are normal. No trismus in the jaw. No uvula swelling. No tonsillar exudate.    Tenderness to palpation right lower jaw  No obvious fluctuance felt.   Floor of mouth soft to palpation. Right lower jaw facial swelling without erythema, warmth.   Neck: Normal range of motion. Neck supple.  Neurological: She is alert and oriented to person, place, and time.  Skin: Skin is warm and dry. She is not diaphoretic.     UC Treatments / Results  Labs (all labs ordered are listed, but  only abnormal results are displayed) Labs Reviewed - No data to display  EKG None  Radiology No results found.  Procedures Procedures (including critical care time)  Medications Ordered in UC Medications - No data to display  Initial Impression / Assessment and Plan / UC Course  I have reviewed the triage vital signs and the nursing notes.  Pertinent labs & imaging results that were available during my care of the patient were reviewed by me and considered in my medical decision making (see chart for details).    Start antibiotics for  dental infection. Symptomatic treatment as needed. Discussed with patient symptoms can return if dental problem is not addressed. Follow up with dentist for further evaluation and treatment of dental pain. Resources given. Return precautions given.   Final Clinical Impressions(s) / UC Diagnoses   Final diagnoses:  Pain, dental  Facial swelling    ED Prescriptions    Medication Sig Dispense Auth. Provider   amoxicillin-clavulanate (AUGMENTIN) 875-125 MG tablet Take 1 tablet by mouth every 12 (twelve) hours. 14 tablet Ellinore Merced V, PA-C   meloxicam (MOBIC) 7.5 MG tablet Take 1 tablet (7.5 mg total) by mouth daily. 15 tablet Karston Hyland V, PA-C   chlorhexidine (PERIDEX) 0.12 % solution Use as directed 15 mLs in the mouth or throat 2 (two) times daily. 120 mL Threasa Alpha, [place] 04/06/18 9604

## 2018-04-06 NOTE — Discharge Instructions (Signed)
Start Augmentin as directed for dental infection. Mobic and tylenol for pain. Peridex for dental hygiene. Follow up with dentist for further treatment and evaluation. If experiencing swelling of the throat, trouble breathing, trouble swallowing, leaning forward to breath, drooling, go to the emergency department for further evaluation.

## 2018-05-07 ENCOUNTER — Other Ambulatory Visit: Payer: Self-pay

## 2018-05-07 ENCOUNTER — Encounter (HOSPITAL_COMMUNITY): Payer: Self-pay | Admitting: Emergency Medicine

## 2018-05-07 ENCOUNTER — Ambulatory Visit (HOSPITAL_COMMUNITY)
Admission: EM | Admit: 2018-05-07 | Discharge: 2018-05-07 | Disposition: A | Discharge status: Home / Self Care | Payer: BLUE CROSS/BLUE SHIELD | Attending: Internal Medicine | Admitting: Internal Medicine

## 2018-05-07 DIAGNOSIS — J40 Bronchitis, not specified as acute or chronic: Secondary | ICD-10-CM

## 2018-05-07 DIAGNOSIS — J0110 Acute frontal sinusitis, unspecified: Secondary | ICD-10-CM

## 2018-05-07 DIAGNOSIS — R030 Elevated blood-pressure reading, without diagnosis of hypertension: Secondary | ICD-10-CM | POA: Diagnosis not present

## 2018-05-07 MED ORDER — BENZONATATE 100 MG PO CAPS: ORAL_CAPSULE | ORAL | 0 refills | Status: AC

## 2018-05-07 MED ORDER — AMOXICILLIN-POT CLAVULANATE 875-125 MG PO TABS: 1.0000 | ORAL_TABLET | Freq: Two times a day (BID) | ORAL | 0 refills | Status: AC

## 2018-05-07 NOTE — Discharge Instructions (Addendum)
Get Netie Pot saline kit and use it twice a day for 5-7 days Do not use tap water or at bed time.

## 2018-05-07 NOTE — ED Provider Notes (Signed)
[facility]    CSN: [id number] Arrival date & time: 05/07/18  1719     History   Chief Complaint Chief Complaint  Patient presents with  . Cough    HPI Marie Garcia is a 52 y.o. female.   Onset of cough 10 days ago, then 2 days after developed stuffy nose and rhinitis. Cough is productive with yellow-green matter. Has been taking Theraflu and Rob DM but have not helped. She monitors her BP at home and normally runs 120-70-80. BP Has been up since sick. Pt feels she is getting worse in the past 2 days. Normally her colds dont last this long. Has heard herself wheeze when she lays down. Is not prone to get secondary infections.      Past Medical History:  Diagnosis Date  . Anemia   . Breast pain   . Hemorrhoid   . HTN (hypertension)   . Menorrhagia     Patient Active Problem List   Diagnosis Date Noted  . Chest pain 02/07/2013  . Fatigue 02/07/2013  . Morbid obesity ([place]) 02/07/2013  . Breast pain   . Anemia   . HTN (hypertension)     Past Surgical History:  Procedure Laterality Date  . TONSILLECTOMY      OB History    Gravida  3   Para  3   Term  3   Preterm      AB      Living  3     SAB      TAB      Ectopic      Multiple      Live Births               Home Medications   none   Family History Family History  Problem Relation Age of Onset  . Breast cancer Maternal Grandmother   . Breast cancer Sister     Social History Social History   Tobacco Use  . Smoking status: Never Smoker  . Smokeless tobacco: Never Used  Substance Use Topics  . Alcohol use: No  . Drug use: No     Allergies   Patient has no known allergies.   Review of Systems Review of Systems  Constitutional: Positive for appetite change and chills. Negative for diaphoresis and fever.       Appetite is down  HENT: Positive for congestion, postnasal drip, rhinorrhea and sinus pressure. Negative for ear discharge, ear pain, hearing loss,  sneezing, sore throat and trouble swallowing.        L ear feels stuffy.   Eyes: Negative for discharge.  Respiratory: Positive for cough and wheezing. Negative for chest tightness and shortness of breath.   Cardiovascular: Negative for chest pain.  Gastrointestinal: Negative for nausea and vomiting.  Musculoskeletal: Negative for arthralgias.  Skin: Negative for rash.  Neurological: Positive for headaches.  Hematological: Negative for adenopathy.     Physical Exam Triage Vital Signs ED Triage Vitals  Enc Vitals Group     BP 05/07/18 1802 (!) 155/82     Pulse Rate 05/07/18 1802 70     Resp 05/07/18 1802 18     Temp 05/07/18 1802 98.6 F (37 C)     Temp Source 05/07/18 1802 Oral     SpO2 05/07/18 1802 100 %     Weight --      Height --      Head Circumference --      Peak Flow --  Pain Score 05/07/18 1759 8     Pain Loc --      Pain Edu? --      Excl. in [place]? --    No data found.  Updated Vital Signs BP (!) 155/82 (BP Location: Right Arm)   Pulse 70   Temp 98.6 F (37 C) (Oral)   Resp 18   SpO2 100%   Visual Acuity Right Eye Distance:   Left Eye Distance:   Bilateral Distance:    Right Eye Near:   Left Eye Near:    Bilateral Near:     Physical Exam  Constitutional: She is oriented to person, place, and time. She appears well-developed. No distress.  HENT:  Head: Normocephalic.  Right Ear: External ear normal.  Mouth/Throat: Oropharynx is clear and moist. No oropharyngeal exudate.  L Tm not visible due to wax Nose with moderate congestion and light green mucous.  Has tender frontal sinuses R>L  Eyes: Conjunctivae are normal. Right eye exhibits no discharge. Left eye exhibits no discharge. No scleral icterus.  Neck: No thyromegaly present.  Cardiovascular: Normal rate and regular rhythm.  No murmur heard. Pulmonary/Chest: Effort normal and breath sounds normal.  Musculoskeletal: Normal range of motion.  Lymphadenopathy:    She has no cervical  adenopathy.  Neurological: She is alert and oriented to person, place, and time.  Skin: Skin is warm and dry. No rash noted. She is not diaphoretic.  Psychiatric: She has a normal mood and affect. Her behavior is normal. Judgment and thought content normal.     UC Treatments / Results  Labs (all labs ordered are listed, but only abnormal results are displayed) Labs Reviewed - No data to display  EKG None  Radiology No results found.  Medications Ordered in UC Medications - No data to display  Initial Impression / Assessment and Plan / UC Course  I have reviewed the triage vital signs and the nursing notes. I placed her on Augmentin and Tessalon as noted. Advised to get Flonase which will help with stuffy nose and advised to use 2 sprays each nose qd x 7 days.   Final Clinical Impressions(s) / UC Diagnoses   Final diagnoses:  Bronchitis  Acute frontal sinusitis, recurrence not specified  Elevated blood pressure reading     Discharge Instructions     Get Netie Pot saline kit and use it twice a day for 5-7 days Do not use tap water or at bed time.     ED Prescriptions    Medication Sig Dispense Auth. Provider   amoxicillin-clavulanate (AUGMENTIN) 875-125 MG tablet Take 1 tablet by mouth every 12 (twelve) hours. 14 tablet Rodriguez-Southworth, Sunday Spillers, PA-C   benzonatate (TESSALON) 100 MG capsule 1-2 tid prn cough 30 capsule Rodriguez-Southworth, Sunday Spillers, PA-C     Controlled Substance Prescriptions [place] Controlled Substance Registry consulted?    Shelby Mattocks, [place] 05/07/18 1849

## 2018-05-07 NOTE — ED Triage Notes (Signed)
Cough for 1 week and 3 days.  Patient has had an irritated throat, stuffy nose and otc theraflu with no relief

## 2019-03-26 ENCOUNTER — Encounter (HOSPITAL_COMMUNITY): Payer: Self-pay

## 2019-03-26 ENCOUNTER — Other Ambulatory Visit: Payer: Self-pay

## 2019-03-26 ENCOUNTER — Ambulatory Visit (HOSPITAL_COMMUNITY)
Admission: EM | Admit: 2019-03-26 | Discharge: 2019-03-26 | Disposition: A | Discharge status: Home / Self Care | Payer: BLUE CROSS/BLUE SHIELD | Attending: Emergency Medicine | Admitting: Emergency Medicine

## 2019-03-26 DIAGNOSIS — U071 COVID-19: Secondary | ICD-10-CM | POA: Insufficient documentation

## 2019-03-26 DIAGNOSIS — Z20822 Contact with and (suspected) exposure to covid-19: Secondary | ICD-10-CM

## 2019-03-26 DIAGNOSIS — Z20828 Contact with and (suspected) exposure to other viral communicable diseases: Secondary | ICD-10-CM

## 2019-03-26 NOTE — ED Provider Notes (Signed)
[facility]    CSN: [id number] Arrival date & time: 03/26/19  1201      History   Chief Complaint Chief Complaint  Patient presents with  . Exposue covid 19    HPI Marie Garcia is a 53 y.o. female.   Marie Garcia presents with her family with requests for covid testing. They all reside together, another family member who was living with them tested positive on 10/14. She denies any symptoms. Denies cough, runny nose, sore throat, gi symptoms, loss of taste or smell, headache, fevers. History of anemia, htn.     If symptoms worsen or do not improve in the next week to return to be seen or to follow up with PCP.       Past Medical History:  Diagnosis Date  . Anemia   . Breast pain   . Hemorrhoid   . HTN (hypertension)   . Menorrhagia     Patient Active Problem List   Diagnosis Date Noted  . Chest pain 02/07/2013  . Fatigue 02/07/2013  . Morbid obesity ([place]) 02/07/2013  . Breast pain   . Anemia   . HTN (hypertension)     Past Surgical History:  Procedure Laterality Date  . TONSILLECTOMY      OB History    Gravida  3   Para  3   Term  3   Preterm      AB      Living  3     SAB      TAB      Ectopic      Multiple      Live Births               Home Medications    Prior to Admission medications   Medication Sig Start Date End Date Taking? Authorizing Provider  amoxicillin-clavulanate (AUGMENTIN) 875-125 MG tablet Take 1 tablet by mouth every 12 (twelve) hours. 05/07/18   Rodriguez-Southworth, Sunday Spillers, PA-C  benzonatate (TESSALON) 100 MG capsule 1-2 tid prn cough 05/07/18   Rodriguez-Southworth, Sunday Spillers, PA-C    Family History Family History  Problem Relation Age of Onset  . Breast cancer Maternal Grandmother   . Breast cancer Sister     Social History Social History   Tobacco Use  . Smoking status: Never Smoker  . Smokeless tobacco: Never Used  Substance Use Topics  . Alcohol use: No  . Drug use: No     Allergies   Patient has no known allergies.   Review of Systems Review of Systems   Physical Exam Triage Vital Signs ED Triage Vitals  Enc Vitals Group     BP 03/26/19 1235 (!) 155/84     Pulse Rate 03/26/19 1235 86     Resp 03/26/19 1235 16     Temp 03/26/19 1235 98.5 F (36.9 C)     Temp Source 03/26/19 1235 Skin     SpO2 03/26/19 1235 100 %     Weight --      Height --      Head Circumference --      Peak Flow --      Pain Score 03/26/19 1237 0     Pain Loc --      Pain Edu? --      Excl. in [place]? --    No data found.  Updated Vital Signs BP (!) 155/84 (BP Location: Right Arm)   Pulse 86   Temp 98.5 F (36.9 C) (Skin)  Resp 16   SpO2 100%    Physical Exam Constitutional:      General: She is not in acute distress.    Appearance: She is well-developed.  Cardiovascular:     Rate and Rhythm: Normal rate.  Pulmonary:     Effort: Pulmonary effort is normal.  Skin:    General: Skin is warm and dry.  Neurological:     Mental Status: She is alert and oriented to person, place, and time.      UC Treatments / Results  Labs (all labs ordered are listed, but only abnormal results are displayed) Labs Reviewed  NOVEL CORONAVIRUS, NAA (HOSP ORDER, SEND-OUT TO REF LAB; TAT 18-24 HRS)    EKG   Radiology No results found.  Procedures Procedures (including critical care time)  Medications Ordered in UC Medications - No data to display  Initial Impression / Assessment and Plan / UC Course  I have reviewed the triage vital signs and the nursing notes.  Pertinent labs & imaging results that were available during my care of the patient were reviewed by me and considered in my medical decision making (see chart for details).     covid testing collected and pending. Counseling provided, including risk of potential false negative. Isolation discussed. Return precautions provided. Patient verbalized understanding and agreeable to plan.   Final Clinical  Impressions(s) / UC Diagnoses   Final diagnoses:  Exposure to COVID-19 virus     Discharge Instructions     Self isolate until your results are back and negative.  Will notify you of any positive findings. You may monitor your results on your MyChart online as well.    It is still possible that this is a false negative, so please isolate if you develop any symptoms.    ED Prescriptions    None     PDMP not reviewed this encounter.   Georgetta Haber, NP 03/26/19 1525

## 2019-03-26 NOTE — Discharge Instructions (Addendum)
Self isolate until your results are back and negative.  Will notify you of any positive findings. You may monitor your results on your MyChart online as well.    It is still possible that this is a false negative, so please isolate if you develop any symptoms.

## 2019-03-26 NOTE — ED Triage Notes (Signed)
Pt present recently exposure to covid 19. Pt family member tested positive.  Pt denies any symptoms.

## 2019-03-28 ENCOUNTER — Telehealth (HOSPITAL_COMMUNITY): Payer: Self-pay | Admitting: Emergency Medicine

## 2019-03-28 ENCOUNTER — Encounter (HOSPITAL_COMMUNITY): Payer: Self-pay

## 2019-03-28 LAB — NOVEL CORONAVIRUS, NAA (HOSP ORDER, SEND-OUT TO REF LAB; TAT 18-24 HRS): SARS-CoV-2, NAA: DETECTED — AB

## 2019-03-28 NOTE — Telephone Encounter (Signed)
Positive Covid, Attempted to reach patient. No answer at this time. Voicemail left.  Mychart message and comment sent.

## 2024-05-12 ENCOUNTER — Encounter: Payer: Self-pay | Admitting: Gastroenterology

## 2024-06-21 ENCOUNTER — Ambulatory Visit: Admitting: Gastroenterology
